# Patient Record
Sex: Female | Born: 1985 | Race: Black or African American | Hispanic: No | Marital: Single | Smoking: Never smoker
Health system: Southern US, Community
[De-identification: ages and names within clinical notes are randomized; demographics above are authoritative.]

## PROBLEM LIST (undated history)

## (undated) DIAGNOSIS — Z789 Other specified health status: Secondary | ICD-10-CM

## (undated) DIAGNOSIS — J189 Pneumonia, unspecified organism: Secondary | ICD-10-CM

---
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
## 2007-02-06 ENCOUNTER — Other Ambulatory Visit: Admission: RE | Admit: 2007-02-06 | Discharge: 2007-02-06 | Payer: Self-pay | Admitting: Obstetrics and Gynecology

## 2007-03-15 ENCOUNTER — Emergency Department (HOSPITAL_COMMUNITY): Admission: EM | Admit: 2007-03-15 | Discharge: 2007-03-15 | Payer: Self-pay | Admitting: Emergency Medicine

## 2008-06-01 ENCOUNTER — Emergency Department (HOSPITAL_COMMUNITY): Admission: EM | Admit: 2008-06-01 | Discharge: 2008-06-01 | Payer: Self-pay | Admitting: Emergency Medicine

## 2008-06-28 ENCOUNTER — Emergency Department (HOSPITAL_COMMUNITY): Admission: EM | Admit: 2008-06-28 | Discharge: 2008-06-29 | Payer: Self-pay | Admitting: Emergency Medicine

## 2009-01-14 ENCOUNTER — Emergency Department (HOSPITAL_COMMUNITY): Admission: EM | Admit: 2009-01-14 | Discharge: 2009-01-14 | Payer: Self-pay | Admitting: Emergency Medicine

## 2009-01-18 ENCOUNTER — Emergency Department (HOSPITAL_COMMUNITY): Admission: EM | Admit: 2009-01-18 | Discharge: 2009-01-18 | Payer: Self-pay | Admitting: Emergency Medicine

## 2009-04-23 ENCOUNTER — Emergency Department (HOSPITAL_COMMUNITY): Admission: EM | Admit: 2009-04-23 | Discharge: 2009-04-23 | Payer: Self-pay | Admitting: Family Medicine

## 2010-02-15 ENCOUNTER — Inpatient Hospital Stay (HOSPITAL_COMMUNITY)
Admission: AD | Admit: 2010-02-15 | Discharge: 2010-02-17 | Payer: Self-pay | Source: Home / Self Care | Attending: Obstetrics and Gynecology | Admitting: Obstetrics and Gynecology

## 2010-02-19 LAB — CBC
HCT: 36.7 % (ref 36.0–46.0)
HCT: 32.9 % — ABNORMAL LOW (ref 36.0–46.0)
Hemoglobin: 12.1 g/dL (ref 12.0–15.0)
Hemoglobin: 10.5 g/dL — ABNORMAL LOW (ref 12.0–15.0)
MCH: 28.7 pg (ref 26.0–34.0)
MCH: 28.1 pg (ref 26.0–34.0)
MCHC: 33 g/dL (ref 30.0–36.0)
MCHC: 31.9 g/dL (ref 30.0–36.0)
MCV: 87 fL (ref 78.0–100.0)
MCV: 88 fL (ref 78.0–100.0)
Platelets: 153 10*3/uL (ref 150–400)
Platelets: 148 10*3/uL — ABNORMAL LOW (ref 150–400)
RBC: 4.22 MIL/uL (ref 3.87–5.11)
RBC: 3.74 MIL/uL — ABNORMAL LOW (ref 3.87–5.11)
RDW: 15.4 % (ref 11.5–15.5)
RDW: 15.6 % — ABNORMAL HIGH (ref 11.5–15.5)
WBC: 11.4 10*3/uL — ABNORMAL HIGH (ref 4.0–10.5)
WBC: 12.4 10*3/uL — ABNORMAL HIGH (ref 4.0–10.5)

## 2010-02-19 LAB — RPR: RPR Ser Ql: NONREACTIVE

## 2010-02-25 ENCOUNTER — Encounter: Payer: Self-pay | Admitting: Obstetrics and Gynecology

## 2010-05-16 LAB — POCT URINALYSIS DIP (DEVICE)
Glucose, UA: NEGATIVE mg/dL
Ketones, ur: >=160 mg/dL — AB
Nitrite: NEGATIVE
Protein, ur: 30 mg/dL — AB
Specific Gravity, Urine: >=1.03 (ref 1.005–1.030)
Urobilinogen, UA: 0.2 mg/dL (ref 0.0–1.0)
pH: 5.5 (ref 5.0–8.0)

## 2010-05-16 LAB — WET PREP, GENITAL
Trich, Wet Prep: NONE SEEN
WBC, Wet Prep HPF POC: NONE SEEN
Yeast Wet Prep HPF POC: NONE SEEN

## 2010-05-16 LAB — POCT PREGNANCY, URINE: Preg Test, Ur: NEGATIVE

## 2010-05-16 LAB — GC/CHLAMYDIA PROBE AMP, GENITAL
Chlamydia, DNA Probe: NEGATIVE
GC Probe Amp, Genital: NEGATIVE

## 2010-06-19 NOTE — H&P (Signed)
Michele Weiss, SETHI NO.:  [id number]   MEDICAL RECORD NO.:  [id number]          PATIENT TYPE:  INP   LOCATION:                                FACILITY:  [facility]   PHYSICIAN:  Gerald Leitz, MD          DATE OF BIRTH:  [birth year]   DATE OF ADMISSION:  09/03/2006  DATE OF DISCHARGE:                              HISTORY & PHYSICAL   HISTORY OF PRESENT ILLNESS:  This is a 25 year old, G1, at 41 weeks and  3 days intrauterine pregnancy based on last menstrual period November 15, 2005, confirmed by ultrasound with estimated date of delivery August 24, 2006.  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  She was  treated for gonorrhea and chlamydia June 27 when she had 2 positive  tests.  Repeat chlamydia testing on July 22 was positive as well.  She  received another dose of azithromycin at that time.  She is being  induced secondary to post dates and is without complaint.   PAST MEDICAL HISTORY:  Negative.   PAST SURGICAL HISTORY:  Negative.   PAST OB HISTORY:  Current.   PAST GYN HISTORY:  Gonorrhea and chlamydia treated during this  pregnancy.  No history of abnormal Pap smears.   MEDICATIONS:  Vitamins.   ALLERGIES:  No known drug allergies.   SOCIAL HISTORY:  The patient is single.  She denies tobacco, alcohol  use.  History of marijuana use.  However, the patient says she did quit  while she is pregnant.   PHYSICAL EXAMINATION:  VITAL SIGNS:  Blood pressure 120/82. Weight 146  pounds.  CARDIOVASCULAR:  Regular rate and rhythm.  LUNGS:  Clear to auscultation bilaterally.  ABDOMEN:  Gravid, nontender.  EXTREMITIES:  Trace edema.  PELVIC:  Cervix 1 cm, 75% effaced, negative 1 station.   PRENATAL LABORATORY DATA:  Blood type A positive.  GBS negative.  HIV  nonreactive.  RPR nonreactive.  Rubella immune.  Hepatitis B negative.  Chlamydia positive on July 22, treated with  azithromycin.   IMPRESSION AND PLAN:  1. A 41-week 3-day intrauterine pregnancy.  2. Post dates for induction.  3. Cytotec per vagina.  4. Anticipate spontaneous vaginal delivery.      Gerald Leitz, MD  Electronically Signed     TC/MEDQ  D:  09/02/2006  T:  09/02/2006  Job:  [id number]

## 2010-11-19 LAB — COMPREHENSIVE METABOLIC PANEL
ALT: 16
AST: 20
Albumin: 2.3 — ABNORMAL LOW
Alkaline Phosphatase: 176 — ABNORMAL HIGH
BUN: 3 — ABNORMAL LOW
CO2: 21
Calcium: 8.7
Chloride: 109
Creatinine, Ser: 0.47
GFR calc Af Amer: >60
GFR calc non Af Amer: >60
Glucose, Bld: 89
Potassium: 3.5
Sodium: 136
Total Bilirubin: 0.5
Total Protein: 5.6 — ABNORMAL LOW

## 2010-11-19 LAB — URINALYSIS, ROUTINE W REFLEX MICROSCOPIC
Glucose, UA: NEGATIVE
Specific Gravity, Urine: 1.02

## 2010-11-19 LAB — URINE MICROSCOPIC-ADD ON

## 2010-11-19 LAB — CBC
HCT: 34.4 — ABNORMAL LOW
HCT: 34.9 — ABNORMAL LOW
Hemoglobin: 11.4 — ABNORMAL LOW
Hemoglobin: 11.4 — ABNORMAL LOW
MCHC: 32.6
MCHC: 33.1
MCV: 87.4
MCV: 87.5
Platelets: 208
Platelets: 224
RBC: 3.27 — ABNORMAL LOW
RBC: 3.93
RBC: 3.99
RDW: 15 — ABNORMAL HIGH
RDW: 15.3 — ABNORMAL HIGH
WBC: 10.7 — ABNORMAL HIGH
WBC: 10.8 — ABNORMAL HIGH
WBC: 12.2 — ABNORMAL HIGH

## 2010-11-19 LAB — RAPID URINE DRUG SCREEN, HOSP PERFORMED
Amphetamines: NOT DETECTED
Barbiturates: NOT DETECTED
Benzodiazepines: NOT DETECTED
Cocaine: NOT DETECTED
Opiates: NOT DETECTED
Tetrahydrocannabinol: NOT DETECTED

## 2010-11-19 LAB — LACTATE DEHYDROGENASE: LDH: 154

## 2010-11-19 LAB — GC/CHLAMYDIA PROBE AMP, GENITAL
Chlamydia, DNA Probe: POSITIVE — AB
GC Probe Amp, Genital: NEGATIVE

## 2010-11-19 LAB — RPR: RPR Ser Ql: NONREACTIVE

## 2011-02-07 ENCOUNTER — Emergency Department (HOSPITAL_COMMUNITY)
Admission: EM | Admit: 2011-02-07 | Discharge: 2011-02-07 | Disposition: A | Discharge status: Home / Self Care | Payer: Medicaid Other | Source: Home / Self Care | Attending: Family Medicine | Admitting: Family Medicine

## 2011-02-07 DIAGNOSIS — K120 Recurrent oral aphthae: Secondary | ICD-10-CM

## 2011-02-07 NOTE — ED Notes (Signed)
C/o "sore" inside of mouth.  States she gets these frequently.

## 2011-02-07 NOTE — ED Provider Notes (Signed)
History     CSN: [id number]  Arrival date & time 02/07/11  0806   First MD Initiated Contact with Patient 02/07/11 [phone]      Chief Complaint  Patient presents with  . Mouth Lesions    (Consider location/radiation/quality/duration/timing/severity/associated sxs/prior treatment) HPI Comments: Michele Weiss presents for evaluation of painful mouth lesions. She states that she gets these intermittently.   Patient is a 26 y.o. female presenting with mouth sores. The history is provided by the patient.  Mouth Lesions  The current episode started 2 days ago. The onset was sudden. The problem occurs occasionally. The problem has been unchanged. The problem is mild. The symptoms are relieved by nothing. The symptoms are aggravated by nothing. Associated symptoms include mouth sores. Pertinent negatives include no fever. She has been eating and drinking normally.    History reviewed. No pertinent past medical history.  History reviewed. No pertinent past surgical history.  No family history on file.  History  Substance Use Topics  . Smoking status: Never Smoker   . Smokeless tobacco: Not on file  . Alcohol Use: No    OB History    Grav Para Term Preterm Abortions TAB SAB Ect Mult Living                  Review of Systems  Constitutional: Negative.  Negative for fever.  HENT: Positive for mouth sores.   Eyes: Negative.   Respiratory: Negative.   Cardiovascular: Negative.   Gastrointestinal: Negative.   Genitourinary: Negative.   Musculoskeletal: Negative.   Skin: Negative.   Neurological: Negative.     Allergies  Review of patient's allergies indicates no known allergies.  Home Medications  No current outpatient prescriptions on file.  BP 106/74  Pulse 69  Temp(Src) 99 F (37.2 C) (Oral)  Resp 16  SpO2 99%  Physical Exam  Nursing note and vitals reviewed. Constitutional: She is oriented to person, place, and time. She appears well-developed and well-nourished.    HENT:  Head: Normocephalic and atraumatic.  Mouth/Throat: Uvula is midline, oropharynx is clear and moist and mucous membranes are normal. Oral lesions present.    Eyes: EOM are normal.  Neck: Normal range of motion.  Pulmonary/Chest: Effort normal.  Musculoskeletal: Normal range of motion.  Neurological: She is alert and oriented to person, place, and time.  Skin: Skin is warm and dry.  Psychiatric: Her behavior is normal.    ED Course  Procedures (including critical care time)  Labs Reviewed - No data to display No results found.   1. Aphthous ulcer       MDM          Richardo Priest, MD 02/07/11 [phone]

## 2011-08-07 ENCOUNTER — Encounter (HOSPITAL_COMMUNITY): Payer: Self-pay | Admitting: Emergency Medicine

## 2011-08-07 ENCOUNTER — Emergency Department (HOSPITAL_COMMUNITY)
Admission: EM | Admit: 2011-08-07 | Discharge: 2011-08-07 | Disposition: A | Discharge status: Home / Self Care | Payer: Medicaid Other | Source: Home / Self Care | Attending: Emergency Medicine | Admitting: Emergency Medicine

## 2011-08-07 DIAGNOSIS — K529 Noninfective gastroenteritis and colitis, unspecified: Secondary | ICD-10-CM

## 2011-08-07 DIAGNOSIS — K5289 Other specified noninfective gastroenteritis and colitis: Secondary | ICD-10-CM

## 2011-08-07 LAB — POCT URINALYSIS DIP (DEVICE)
Bilirubin Urine: NEGATIVE
Glucose, UA: NEGATIVE mg/dL
Ketones, ur: NEGATIVE mg/dL
Nitrite: NEGATIVE
Specific Gravity, Urine: 1.025 (ref 1.005–1.030)

## 2011-08-07 NOTE — ED Provider Notes (Signed)
History     CSN: [id number]  Arrival date & time 08/07/11  1437   None     Chief Complaint  Patient presents with  . Abdominal Pain    (Consider location/radiation/quality/duration/timing/severity/associated sxs/prior treatment) HPI Comments: Pt with 4 episodes watery/liquid diarrhea this morning.  Associated with abd pain pt describes at "a contraction".  Pt's pain mild/improved now.  Feels nauseated but no vomiting. Concerned may have a problem with her IUD.  Has had IUD for 1.5 years, has had bleeding with IUD since it was placed.  Denies any changes in her vaginal bleeding from usual.  Has not spoken with OB who placed IUD about bleeding.   Patient is a 26 y.o. female presenting with abdominal pain. The history is provided by the patient.  Abdominal Pain The primary symptoms of the illness include abdominal pain, nausea, diarrhea and vaginal bleeding. The primary symptoms of the illness do not include fever, vomiting, dysuria or vaginal discharge. The current episode started 6 to 12 hours ago. The onset of the illness was sudden. The problem has been gradually improving.  The abdominal pain is generalized. The abdominal pain does not radiate. The severity of the abdominal pain is 8/10. The abdominal pain is relieved by bowel movement.  The patient states that she believes she is currently not pregnant. The patient has had a change in bowel habit. Symptoms associated with the illness do not include chills, frequency or back pain.    History reviewed. No pertinent past medical history.  History reviewed. No pertinent past surgical history.  History reviewed. No pertinent family history.  History  Substance Use Topics  . Smoking status: Never Smoker   . Smokeless tobacco: Not on file  . Alcohol Use: Yes    OB History    Grav Para Term Preterm Abortions TAB SAB Ect Mult Living                  Review of Systems  Constitutional: Negative for fever and chills.    Gastrointestinal: Positive for nausea, abdominal pain and diarrhea. Negative for vomiting and blood in stool.  Genitourinary: Positive for vaginal bleeding. Negative for dysuria, frequency, flank pain, vaginal discharge and vaginal pain.  Musculoskeletal: Negative for back pain.    Allergies  Review of patient's allergies indicates no known allergies.  Home Medications  No current outpatient prescriptions on file.  BP 95/64  Pulse 74  Temp 98.7 F (37.1 C) (Oral)  Resp 14  SpO2 100%  Physical Exam  Constitutional: She appears well-developed and well-nourished. No distress.  Cardiovascular: Normal rate and regular rhythm.   Pulmonary/Chest: Effort normal and breath sounds normal.  Abdominal: Soft. Normal appearance. She exhibits no distension. Bowel sounds are increased. There is generalized tenderness. There is no rigidity, no rebound, no guarding and no CVA tenderness.  Genitourinary: Vagina normal. There is no rash, tenderness or lesion on the right labia. There is no rash, tenderness or lesion on the left labia. Uterus is tender. Cervix exhibits no motion tenderness and no friability. Right adnexum displays mass. No vaginal discharge found.       Bleeding from cervical os is old, dark blood, small amount.  IUD string intact.     ED Course  Procedures (including critical care time)  Labs Reviewed  POCT URINALYSIS DIP (DEVICE) - Abnormal; Notable for the following:    Hgb urine dipstick MODERATE (*)     All other components within normal limits  POCT PREGNANCY, URINE  No results found.   1. Enteritis       MDM  Reassured pt IUD string intact.  As pt's bleeding is not any different from when she had mirena placed 1.5 years ago, it unlikely to be cause of pt's abd pain today.  Review[facility] with [facility]; pt to f/u with OB for management of mirena or removal.          Cathlyn Parsons, NP 08/07/11 1728

## 2011-08-07 NOTE — ED Notes (Signed)
Abdominal pain onset this am.  Reports entire abdomen tightening like a "contraction"  Denies urinary symptoms.  Diarrhea all day today.  Has felt nauseated, but no vomiting .

## 2011-08-08 NOTE — ED Provider Notes (Signed)
Medical screening examination/treatment/procedure(s) were performed by non-physician practitioner and as supervising physician I was immediately available for consultation/collaboration.  Raynald Blend, MD 08/08/11 1453

## 2012-03-22 ENCOUNTER — Emergency Department (HOSPITAL_COMMUNITY)
Admission: EM | Admit: 2012-03-22 | Discharge: 2012-03-22 | Disposition: A | Discharge status: Home / Self Care | Payer: Medicaid Other | Source: Home / Self Care | Attending: Emergency Medicine | Admitting: Emergency Medicine

## 2012-03-22 ENCOUNTER — Other Ambulatory Visit (HOSPITAL_COMMUNITY)
Admission: RE | Admit: 2012-03-22 | Discharge: 2012-03-22 | Disposition: A | Discharge status: Home / Self Care | Payer: Medicaid Other | Source: Ambulatory Visit | Attending: Emergency Medicine | Admitting: Emergency Medicine

## 2012-03-22 ENCOUNTER — Encounter (HOSPITAL_COMMUNITY): Payer: Self-pay | Admitting: Emergency Medicine

## 2012-03-22 DIAGNOSIS — A6000 Herpesviral infection of urogenital system, unspecified: Secondary | ICD-10-CM

## 2012-03-22 DIAGNOSIS — A6009 Herpesviral infection of other urogenital tract: Secondary | ICD-10-CM

## 2012-03-22 DIAGNOSIS — N760 Acute vaginitis: Secondary | ICD-10-CM | POA: Insufficient documentation

## 2012-03-22 DIAGNOSIS — Z113 Encounter for screening for infections with a predominantly sexual mode of transmission: Secondary | ICD-10-CM | POA: Insufficient documentation

## 2012-03-22 LAB — POCT URINALYSIS DIP (DEVICE)
Bilirubin Urine: NEGATIVE
Hgb urine dipstick: NEGATIVE
Nitrite: NEGATIVE
pH: 7.5 (ref 5.0–8.0)

## 2012-03-22 MED ORDER — ACYCLOVIR 400 MG PO TABS: 400.0000 mg | ORAL_TABLET | ORAL | Status: DC

## 2012-03-22 MED ORDER — ACYCLOVIR 400 MG PO TABS: 400.0000 mg | ORAL_TABLET | Freq: Two times a day (BID) | ORAL | Status: DC

## 2012-03-22 NOTE — ED Provider Notes (Signed)
Chief Complaint  Patient presents with  . Vaginal Pain    vaginal pain and sores x 1 wk.     History of Present Illness:   Michele Weiss is a 27 year old female who has had a six-day history of vulvar pain. She's had a slight white discharge as well. She notes some odor. The patient had a Mirena inserted 05/04/2010. Ever since then she's had light bleeding at the beginning of every month. She had had pain up until this past week. She denies any fever, chills, nausea, vomiting, pelvic pain, lower back pain, and has had slight dysuria but no frequency or urgency. She denies any history of STDs or herpes. She has no medication allergies, takes no medications and has no other medical illnesses.  Review of Systems:  Other than noted above, the patient denies any of the following symptoms: Systemic:  No fever, chills, sweats, fatigue, or weight loss. GI:  No abdominal pain, nausea, anorexia, vomiting, diarrhea, constipation, melena or hematochezia. GU:  No dysuria, frequency, urgency, hematuria, vaginal discharge, itching, or abnormal vaginal bleeding. Skin:  No rash or itching.   PMFSH:  Past medical history, family history, social history, meds, and allergies were reviewed.  Physical Exam:   Vital signs:  BP 126/88  Pulse 87  Temp(Src) 98.9 F (37.2 C) (Oral)  Resp 18  SpO2 100%  LMP 03/17/2012 General:  Alert, oriented and in no distress. Lungs:  Breath sounds clear and equal bilaterally.  No wheezes, rales or rhonchi. Heart:  Regular rhythm.  No gallops or murmers. Abdomen:  Soft, flat and non-distended.  No organomegaly or mass.  No tenderness, guarding or rebound.  Bowel sounds normally active. Pelvic exam:  External genitalia reveals multiple small, shallow, painful ulcerations on the vulva consistent with herpes. Insertion of speculum was very painful for. She had a large amount of watery discharge which was brownish red in color. Her IUD strings are in place. Cervix otherwise  appeared normal. She had no cervical motion pain, uterus was normal in size and shape and nontender. No adnexal tenderness or mass. Skin:  Clear, warm and dry.  Labs:   Results for orders placed during the hospital encounter of 03/22/12  POCT URINALYSIS DIP (DEVICE)      Result Value Range   Glucose, UA NEGATIVE  NEGATIVE mg/dL   Bilirubin Urine NEGATIVE  NEGATIVE   Ketones, ur NEGATIVE  NEGATIVE mg/dL   Specific Gravity, Urine 1.020  1.005 - 1.030   Hgb urine dipstick NEGATIVE  NEGATIVE   pH 7.5  5.0 - 8.0   Protein, ur NEGATIVE  NEGATIVE mg/dL   Urobilinogen, UA 1.0  0.0 - 1.0 mg/dL   Nitrite NEGATIVE  NEGATIVE   Leukocytes, UA MODERATE (*) NEGATIVE  POCT PREGNANCY, URINE      Result Value Range   Preg Test, Ur NEGATIVE  NEGATIVE    Other Labs Obtained at Urgent Care Center:  DNA probes for gonorrhea, Chlamydia, Trichomonas, Gardnerella, and Candida were obtained as well as serologies for HIV, syphilis, herpes simplex type II, and a viral culture for herpes simplex type II.  Results are pending at this time and we will call about any positive results.  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  Plan:   1.  The following meds were prescribed:   New Prescriptions   ACYCLOVIR (ZOVIRAX) 400 MG TABLET    Take 1 tablet (400 mg total) by mouth every 4 (four) hours while awake.  ACYCLOVIR (ZOVIRAX) 400 MG TABLET    Take 1 tablet (400 mg total) by mouth 2 (two) times daily.   2.  The patient was instructed in symptomatic care and handouts were given. 3.  The patient was told to return if becoming worse in any way, if no better in 3 or 4 days, and given some red flag symptoms that would indicate earlier return.    Reuben Likes, MD 03/22/12 1556

## 2012-03-22 NOTE — ED Notes (Signed)
Please call pt on cell for lab issues 

## 2012-03-22 NOTE — ED Notes (Signed)
Pt c/o vaginal pain/tenderness and sores. Discharge with odor. Having pain with intercourse and with urinating. Symptoms present x 1 wk.  Pt has not tried any otc meds for treatment. Pt is currently on cycle.

## 2012-03-23 ENCOUNTER — Telehealth (HOSPITAL_COMMUNITY): Payer: Self-pay | Admitting: Emergency Medicine

## 2012-03-23 ENCOUNTER — Telehealth (HOSPITAL_COMMUNITY): Payer: Self-pay | Admitting: *Deleted

## 2012-03-23 LAB — HIV ANTIBODY (ROUTINE TESTING W REFLEX): HIV: NONREACTIVE

## 2012-03-23 LAB — HSV 2 ANTIBODY, IGG: HSV 2 Glycoprotein G Ab, IgG: 6.13 IV — ABNORMAL HIGH

## 2012-03-23 MED ORDER — LIDOCAINE HCL 2 % EX GEL: CUTANEOUS | Status: DC | PRN

## 2012-03-23 MED ORDER — METRONIDAZOLE 500 MG PO TABS: 500.0000 mg | ORAL_TABLET | Freq: Two times a day (BID) | ORAL | Status: DC

## 2012-03-23 NOTE — ED Notes (Signed)
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 Pt. adequately treated with Zovirax.  Order obtained from Dr. Lorenz Coaster for Flagyl.  I called pt.  Pt. verified x 2 and given results except Herpes culture. I told her I would only call her back if it was positive. Pt.'s questions answered.   Pt. told she needs Flagyl and it was at her preferred pharmacy.  Pt. instructed to no alcohol while taking this medication.  Pt. asked for medication for the pain. I told her I would ask the doctor to send something to her pharmacy for this.  Discussed with Dr. Lorenz Coaster and he said he would e- prescribe Lidocaine gel. Michele Weiss 03/23/2012

## 2012-03-23 NOTE — ED Notes (Signed)
Her DNA probe came back positive for Gardnerella as well. She will need Flagyl 500 mg twice a day for a week.  Reuben Likes, MD 03/23/12 [phone]

## 2012-03-23 NOTE — Telephone Encounter (Signed)
Message copied by Reuben Likes on Mon Mar 23, 2012  7:28 PM ------      Message from: Vassie Moselle      Created: Mon Mar 23, 2012  6:44 PM      Regarding: labs       Gardnerella pos. Herpes culture pending. 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  Rest of labs neg.  Treated with Zovirax only.      Vassie Moselle      03/23/2012            ----- Message -----         From: [facility]         Sent: 03/23/2012   1:52 AM           To: [facility] Follow Up                   ------

## 2012-03-25 LAB — HERPES SIMPLEX VIRUS CULTURE

## 2012-03-27 ENCOUNTER — Telehealth (HOSPITAL_COMMUNITY): Payer: Self-pay | Admitting: *Deleted

## 2012-03-27 NOTE — ED Notes (Signed)
Pt. called back.  Pt. verified x 2 and given Herpes culture result.  Pt. told she was adequately treated.  Pt. instructed to notify her partner. You can pass the virus even when you don't have an outbreak, so always practice safe sex. Get treated for each outbreak with Acyclovir or Valtrex. You may want to get an GYN or PCP  who can call in a prescription for you when you have an outbreak, or give you a Rx. for prophylactic treatment to prevent frequent outbreaks.  She said Dr. Lorenz Coaster gave her another Rx. for that after she finishes the current Rx. she is taking. I explained that we would not be able to refill it, so she needs a doctor.  Pt. voiced understanding. Vassie Moselle 03/27/2012

## 2012-03-27 NOTE — ED Notes (Signed)
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  Pt. adequately treated with Zovirax. I called and left a message to call. Vassie Moselle 03/27/2012

## 2012-07-26 ENCOUNTER — Emergency Department (HOSPITAL_COMMUNITY)
Admission: EM | Admit: 2012-07-26 | Discharge: 2012-07-26 | Disposition: A | Discharge status: Home / Self Care | Payer: Self-pay | Source: Home / Self Care

## 2013-12-09 ENCOUNTER — Emergency Department (HOSPITAL_COMMUNITY): Payer: Medicaid Other

## 2013-12-09 ENCOUNTER — Emergency Department (HOSPITAL_COMMUNITY)
Admission: EM | Admit: 2013-12-09 | Discharge: 2013-12-09 | Disposition: A | Discharge status: Home / Self Care | Payer: Medicaid Other | Attending: Emergency Medicine | Admitting: Emergency Medicine

## 2013-12-09 ENCOUNTER — Encounter (HOSPITAL_COMMUNITY): Payer: Self-pay | Admitting: *Deleted

## 2013-12-09 DIAGNOSIS — Z792 Long term (current) use of antibiotics: Secondary | ICD-10-CM | POA: Insufficient documentation

## 2013-12-09 DIAGNOSIS — R209 Unspecified disturbances of skin sensation: Secondary | ICD-10-CM

## 2013-12-09 DIAGNOSIS — H5789 Other specified disorders of eye and adnexa: Secondary | ICD-10-CM

## 2013-12-09 DIAGNOSIS — R202 Paresthesia of skin: Secondary | ICD-10-CM | POA: Insufficient documentation

## 2013-12-09 DIAGNOSIS — Z79899 Other long term (current) drug therapy: Secondary | ICD-10-CM | POA: Insufficient documentation

## 2013-12-09 DIAGNOSIS — Z8619 Personal history of other infectious and parasitic diseases: Secondary | ICD-10-CM | POA: Insufficient documentation

## 2013-12-09 DIAGNOSIS — R930 Abnormal findings on diagnostic imaging of skull and head, not elsewhere classified: Secondary | ICD-10-CM | POA: Insufficient documentation

## 2013-12-09 DIAGNOSIS — H578 Other specified disorders of eye and adnexa: Secondary | ICD-10-CM | POA: Insufficient documentation

## 2013-12-09 NOTE — ED Provider Notes (Signed)
CSN: [id number]     Arrival date & time 12/09/13  1413 History   First MD Initiated Contact with Patient 12/09/13 1822     Chief Complaint  Patient presents with  . Facial numbness      (Consider location/radiation/quality/duration/timing/severity/associated sxs/prior Treatment) HPI Comments: Michele Weiss is a 28 y.o. 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 She reports that it began around her lips on the L side, and has spread up towards her eye, and feels like it's going "behind the eye" on her L side. States the sensation is diminished, but that she can still feel herself touching the skin. Compares it to the sensation when the novocaine is wearing off at the dentists. The sensation is also present on the L side of her tongue. She denies any known triggers or aggravating/alleviating factors. Denies pain in the affected areas of her face. She has not tried anything for her symptoms. She also states this morning she awoke with mild eye redness which she attributed to allergies. Denies fevers, chills, facial tingling, rashes, itching, ocular discharge or itching, tearing, eye pain, vision changes, rhinorrhea, sinus congestion, sore throat, drooling, ear pain/discharge, tinnitus, hearing loss, vertigo, dizziness, lightheadedness, syncope, CP, SOB, abd pain, n/v/d/c, melena, hematochezia, myalgias, arthralgias, paresthesias/tingling/weakness in any other areas of her body, facial asymmetry or weakness, or focal neuro deficits. Does not take anything for her HSV1, which she states was diagnosed in childhood after getting a cold sore.  Patient is a 28 y.o. female presenting with general illness. The history is provided by the patient. No language interpreter was used.  Illness Location:  L sided face Quality:  "numbness"/decreased sensation Severity:  Mild Onset quality:  Gradual Duration:  2  weeks Timing:  Constant Progression:  Unchanged Chronicity:  New Context:  No injury, no known trigger Relieved by:  None tried Worsened by:  None tried Ineffective treatments:  None tried Associated symptoms: no abdominal pain, no chest pain, no congestion, no cough, no diarrhea, no ear pain, no fever, no headaches, no loss of consciousness, no myalgias, no nausea, no rash, no rhinorrhea, no shortness of breath, no sore throat and no vomiting   Risk factors:  +HSV1 in childhood   History reviewed. No pertinent past medical history. History reviewed. No pertinent past surgical history. History reviewed. No pertinent family history. History  Substance Use Topics  . Smoking status: Never Smoker   . Smokeless tobacco: Not on file  . Alcohol Use: Yes   OB History    No data available     Review of Systems  Constitutional: Negative for fever and chills.  HENT: Negative for congestion, drooling, ear discharge, ear pain, facial swelling, hearing loss, rhinorrhea, sinus pressure, sore throat, tinnitus and trouble swallowing.   Eyes: Positive for redness. Negative for photophobia, pain, discharge, itching and visual disturbance.  Respiratory: Negative for cough and shortness of breath.   Cardiovascular: Negative for chest pain.  Gastrointestinal: Negative for nausea, vomiting, abdominal pain, diarrhea, constipation and blood in stool.  Musculoskeletal: Negative for myalgias, back pain, arthralgias, gait problem, neck pain and neck stiffness.  Skin: Negative for color change and rash.  Allergic/Immunologic: Positive for environmental allergies.  Neurological: Positive for numbness (L sided facial paresthesias). Negative for dizziness, tremors, loss of consciousness, syncope, facial asymmetry, speech difficulty, weakness, light-headedness and headaches.  Psychiatric/Behavioral: Negative for confusion.   10 Systems reviewed and are  negative for acute change except as noted in the HPI.     Allergies  Review of patient's allergies indicates no known allergies.  Home Medications   Prior to Admission medications   Medication Sig Start Date End Date Taking? Authorizing Provider  ibuprofen (ADVIL,MOTRIN) 200 MG tablet Take 800 mg by mouth every 6 (six) hours as needed for headache.   Yes Historical Provider, MD  levonorgestrel (MIRENA) 20 MCG/24HR IUD 1 each by Intrauterine route once.   Yes Historical Provider, MD  acyclovir (ZOVIRAX) 400 MG tablet Take 1 tablet (400 mg total) by mouth every 4 (four) hours while awake. 03/22/12   Reuben Likesavid C Keller, MD  acyclovir (ZOVIRAX) 400 MG tablet Take 1 tablet (400 mg total) by mouth 2 (two) times daily. 03/22/12   Reuben Likesavid C Keller, MD  lidocaine (XYLOCAINE JELLY) 2 % jelly Apply topically as needed. 03/23/12   Reuben Likesavid C Keller, MD  metroNIDAZOLE (FLAGYL) 500 MG tablet Take 1 tablet (500 mg total) by mouth 2 (two) times daily. 03/23/12   Reuben Likesavid C Keller, MD   BP 110/78 mmHg  Pulse 87  Temp(Src) 98.3 F (36.8 C) (Oral)  Resp 20  Ht 5\' 2"  (1.575 m)  Wt 130 lb (58.968 kg)  BMI 23.77 kg/m2  SpO2 99%  LMP 11/21/2013 Physical Exam  Constitutional: She is oriented to person, place, and time. Vital signs are normal. She appears well-developed and well-nourished.  Non-toxic appearance. No distress.  Afebrile, nontoxic, NAD  HENT:  Head: Normocephalic and atraumatic.  Right Ear: Hearing, tympanic membrane, external ear and ear canal normal.  Left Ear: Hearing, tympanic membrane, external ear and ear canal normal.  Nose: Mucosal edema present. No rhinorrhea. Right sinus exhibits no maxillary sinus tenderness and no frontal sinus tenderness. Left sinus exhibits no maxillary sinus tenderness and no frontal sinus tenderness.  Mouth/Throat: Uvula is midline, oropharynx is clear and moist and mucous membranes are normal. No trismus in the jaw. No uvula swelling.  [place]/AT Ears clear bilaterally B/l nasal turbinates mildly edematous and erythematous with  cobblestoning, no rhinorrhea Oropharynx clear and moist  Eyes: EOM are normal. Pupils are equal, round, and reactive to light. Right eye exhibits no discharge. Left eye exhibits no discharge. Right conjunctiva is injected. Left conjunctiva is injected.  B/l global injection of conjunctiva, no drainage. EOMI, PERRL  Neck: Normal range of motion. Neck supple. No JVD present. Carotid bruit is not present.  Cardiovascular: Normal rate, regular rhythm, normal heart sounds and intact distal pulses.  Exam reveals no gallop and no friction rub.   No murmur heard. Pulmonary/Chest: Effort normal and breath sounds normal. No respiratory distress. She has no decreased breath sounds. She has no wheezes. She has no rhonchi. She has no rales.  Abdominal: Soft. Normal appearance and bowel sounds are normal. She exhibits no distension. There is no tenderness. There is no rigidity, no rebound, no guarding, no tenderness at McBurney's point and negative Murphy's sign.  Musculoskeletal: Normal range of motion.  MAE x4 Strength 5/5 in all extremities Sensation grossly intact Gait nonataxic  Lymphadenopathy:    She has no cervical adenopathy.  Neurological: She is alert and oriented to person, place, and time. She has normal strength. No cranial nerve deficit or sensory deficit. She displays a negative Romberg sign. Coordination and gait normal. GCS eye subscore is 4. GCS verbal subscore is 5. GCS motor subscore is 6.  CN 2-12 grossly intact Sensation over CN5 distribution of intact to sharp vs light touch, but pt  endorses a "different" sensation on the L side Sensation and strength equal and intact in all extremities Neg romberg, neg pronator drift, coordination with finger-to-nose WNL, gait WNL    Skin: Skin is warm, dry and intact. No rash noted.  No rashes  Psychiatric: She has a normal mood and affect.  Nursing note and vitals reviewed.   ED Course  Procedures (including critical care time) Labs  Review Labs Reviewed - No data to display  Imaging Review Ct Head Wo Contrast  12/09/2013   CLINICAL DATA:  Left facial numbness and bilateral temporal headache for 2 weeks.  EXAM: CT HEAD WITHOUT CONTRAST  TECHNIQUE: Contiguous axial images were obtained from the base of the skull through the vertex without intravenous contrast.  COMPARISON:  01/14/2009  FINDINGS: Skull and Sinuses:No acute fracture or destructive process.  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 This has developed since 2010, as permitted by slice thickness and slice selection. This area is low-density on brain windows and likely contain CSF. These changes are discrete from the petrous apex which is bilaterally aerated. There is a questionable change in the density of the sella, possibly now with an empty appearance.  Orbits: No new thickening of the optic sheaths.  Brain: No evidence of acute abnormality, such as acute infarction, hemorrhage, hydrocephalus, or mass lesion/mass effect.  IMPRESSION: 1. No acute intracranial findings. 2. 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 This can be related to alteration in CSF pressure; if there are chronic headaches, consider workup for pseudotumor cerebri.   Electronically Signed   By: Tiburcio PeaJonathan  Watts M.D.   On: 12/09/2013 20:25     EKG Interpretation None      MDM   Final diagnoses:  Facial paresthesia  Abnormal CT scan of head  Redness of eye, left    28y/o female with L sided facial paresthesias, hx of HSV1 in childhood. DDx wide, but could include trigeminal neuralgia vs HSV of trigeminal nerve vs tumor vs other intracranial pathology. Will obtain CT head to eval for tumor or other changes. Plan to refer to opthalmology for eye redness that began today, given her hx of HSV1, but doubt any concerning etiology or need for emergent work up given that she has no ocular pain or discharge or vision changes today.  Doubt glaucoma or corneal ulcers. This redness could still be allergic in nature. Could consider starting on valtrex for HSV but will hold off until CT results back. Plan to likely refer to neurology for ongoing management of facial paresthesia, and any other findings from CT.  9:05 PM CT showing no acute changes, does show meckel's cave remodeling possibly related to alteration in CSF pressure. Pt with chronic headaches, and per radiologist, she would warrant w/up for idiopathic intracranial hypertension (pseudotumor cerebri). Given no concerning neuro deficits at this time and not complaining of HA at this time, doubt need for emergent LP, will defer to neurology for management and work up. 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 Will still proceed with neurology referral, and discussed referral to ophthalmology if ocular redness does not diminish by tomorrow after using home allergy meds and OTC visine. I explained the diagnosis and have given explicit precautions to return to the ER including for any other new  or worsening symptoms. The patient understands and accepts the medical plan as it's been dictated and I have answered their questions. Discharge instructions concerning home care and prescriptions have been given. The patient is STABLE and is discharged to home in good condition.  BP 110/78 mmHg  Pulse 87  Temp(Src) 98.3 F (36.8 C) (Oral)  Resp 20  Ht 5\' 2"  (1.575 m)  Wt 130 lb (58.968 kg)  BMI 23.77 kg/m2  SpO2 99%  LMP 11/21/2013   [place]ita [place] Camprubi-Soms, PA-C 12/09/13 2115  Vanetta Mulders, MD 12/13/13 1811

## 2013-12-09 NOTE — ED Notes (Addendum)
Pt reports left side face numbness x2 weeks.  Pt reports it started with half of her mouth and nostril and cheek on the right side. Pt reports when she woke up today she felt the numbness enter her eye and under chin on the left side.  Pt denies any pain, or any loss of function in her face.  Pt denies any weakness in any extremities. Pt reports h/a since early today on left lateral temple.

## 2013-12-09 NOTE — Discharge Instructions (Signed)
Your symptoms will need further work up by a neurologist. Call the neurologist listed above for ongoing management and treatment of your symptoms. Try taking your home allergy medications, or over-the-counter visine for your eye redness, but if this doesn't resolve then see the ophthalmologist listed above for further evaluation of this symptom. Return to the ER for changes or worsening in your symptoms.   Paresthesia Paresthesia is a burning or prickling feeling. This feeling can happen in any part of the body. It often happens in the hands, arms, legs, or feet. HOME CARE  Avoid drinking alcohol.  Try massage or needle therapy (acupuncture) to help with your problems.  Keep all doctor visits as told. GET HELP RIGHT AWAY IF:   You feel weak.  You have trouble walking or moving.  You have problems speaking or seeing.  You feel confused.  You cannot control when you poop (bowel movement) or pee (urinate).  You lose feeling (numbness) after an injury.  You pass out (faint).  Your burning or prickling feeling gets worse when you walk.  You have pain, cramps, or feel dizzy.  You have a rash. MAKE SURE YOU:   Understand these instructions.  Will watch your condition.  Will get help right away if you are not doing well or get worse. Document Released: 01/04/2008 Document Revised: 04/15/2011 Document Reviewed: 10/12/2010 [facility] Patient Information 2015 [place], [place]. This information is not intended to replace advice given to you by your health care provider. Make sure you discuss any questions you have with your health care provider.

## 2013-12-09 NOTE — ED Notes (Signed)
Pt in c/o left facial numbness that starts around her mouth to her jaw line, up to eye brow area, states symptoms started two weeks ago and have been getting progressively worse, no other neuro deficits noted

## 2014-01-24 ENCOUNTER — Telehealth: Payer: Self-pay | Admitting: Neurology

## 2014-01-24 ENCOUNTER — Ambulatory Visit: Payer: Medicaid Other | Admitting: Neurology

## 2014-01-24 NOTE — Telephone Encounter (Signed)
Pt decided to not be seen as  NP on 01/24/14 w/ Dr. Everlena CooperJaffe. Pt did not have her insurance card and due to her  Insurance being medicaid/famioly planning, her insurance was not guaranteed to pay for her visit unless it was related to her pregnancy.  Pt at first wanted to be seen but then changed her mind and left the office.  She was a ER referral.

## 2014-01-25 ENCOUNTER — Telehealth: Payer: Self-pay | Admitting: Neurology

## 2014-01-25 NOTE — Telephone Encounter (Signed)
Pt no showed 01/24/14 appt with Dr. Allena KatzPatel / ER referral.  Michele DroughtErica - please send no show letter to patient / Sherri S.

## 2014-01-26 ENCOUNTER — Encounter: Payer: Self-pay | Admitting: *Deleted

## 2014-01-26 NOTE — Progress Notes (Signed)
No show letter sent for 01/24/2014 

## 2015-02-05 HISTORY — PX: OOPHORECTOMY: SHX86

## 2016-02-05 NOTE — L&D Delivery Note (Signed)
Delivery Note At 4:27 PM a viable female was delivered via Vaginal, Spontaneous Delivery (Presentation: LOA;  ).  APGAR: 9, 9; weight  .   Placenta status: , .  Cord:  with the following complications: .  Cord pH: NA  Anesthesia:  None  Episiotomy: None Lacerations:  Small hemostatic first degree Suture Repair: NA Est. Blood Loss (mL):  200 cc  Mom to postpartum.  Baby to Couplet care / Skin to Skin.  Michele Weiss, Michele Weiss 04/02/2016, 5:00 PM

## 2016-03-26 ENCOUNTER — Encounter (HOSPITAL_COMMUNITY): Payer: Self-pay

## 2016-03-26 ENCOUNTER — Inpatient Hospital Stay (HOSPITAL_COMMUNITY)
Admission: AD | Admit: 2016-03-26 | Discharge: 2016-03-26 | Disposition: A | Discharge status: Home / Self Care | Payer: Medicaid Other | Source: Ambulatory Visit | Attending: Family Medicine | Admitting: Family Medicine

## 2016-03-26 DIAGNOSIS — Z3A40 40 weeks gestation of pregnancy: Secondary | ICD-10-CM | POA: Diagnosis not present

## 2016-03-26 DIAGNOSIS — O0933 Supervision of pregnancy with insufficient antenatal care, third trimester: Secondary | ICD-10-CM | POA: Diagnosis not present

## 2016-03-26 DIAGNOSIS — Z3493 Encounter for supervision of normal pregnancy, unspecified, third trimester: Secondary | ICD-10-CM | POA: Diagnosis not present

## 2016-03-26 HISTORY — DX: Other specified health status: Z78.9

## 2016-03-26 LAB — DIFFERENTIAL
Basophils Absolute: 0 10*3/uL (ref 0.0–0.1)
Basophils Relative: 0 %
EOS PCT: 1 %
Eosinophils Absolute: 0.1 10*3/uL (ref 0.0–0.7)
LYMPHS ABS: 1.9 10*3/uL (ref 0.7–4.0)
LYMPHS PCT: 22 %
Monocytes Absolute: 0.4 10*3/uL (ref 0.1–1.0)
Monocytes Relative: 5 %
NEUTROS ABS: 6.3 10*3/uL (ref 1.7–7.7)
NEUTROS PCT: 72 %

## 2016-03-26 LAB — RAPID HIV SCREEN (HIV 1/2 AB+AG)
HIV 1/2 ANTIBODIES: NONREACTIVE
HIV-1 P24 ANTIGEN - HIV24: NONREACTIVE

## 2016-03-26 LAB — TYPE AND SCREEN
ABO/RH(D): A POS
ANTIBODY SCREEN: NEGATIVE

## 2016-03-26 LAB — URINALYSIS, ROUTINE W REFLEX MICROSCOPIC
Bilirubin Urine: NEGATIVE
GLUCOSE, UA: 50 mg/dL — AB
Hgb urine dipstick: NEGATIVE
Ketones, ur: NEGATIVE mg/dL
LEUKOCYTES UA: NEGATIVE
NITRITE: NEGATIVE
PH: 6 (ref 5.0–8.0)
PROTEIN: NEGATIVE mg/dL
Specific Gravity, Urine: 1.015 (ref 1.005–1.030)

## 2016-03-26 LAB — OB RESULTS CONSOLE GBS: STREP GROUP B AG: NEGATIVE

## 2016-03-26 LAB — CBC
HCT: 34 % — ABNORMAL LOW (ref 36.0–46.0)
HEMOGLOBIN: 11.5 g/dL — AB (ref 12.0–15.0)
MCH: 29.6 pg (ref 26.0–34.0)
MCHC: 33.8 g/dL (ref 30.0–36.0)
MCV: 87.4 fL (ref 78.0–100.0)
PLATELETS: 150 10*3/uL (ref 150–400)
RBC: 3.89 MIL/uL (ref 3.87–5.11)
RDW: 14.1 % (ref 11.5–15.5)
WBC: 8.7 10*3/uL (ref 4.0–10.5)

## 2016-03-26 LAB — HEPATITIS B SURFACE ANTIGEN: Hepatitis B Surface Ag: NEGATIVE

## 2016-03-26 NOTE — Discharge Instructions (Signed)
Introduction Patient Name: ________________________________________________ Patient Due Date: ____________________ What is a fetal movement count? A fetal movement count is the number of times that you feel your baby move during a certain amount of time. This may also be called a fetal kick count. A fetal movement count is recommended for every pregnant woman. You may be asked to start counting fetal movements as early as week 28 of your pregnancy. Pay attention to when your baby is most active. You may notice your baby's sleep and wake cycles. You may also notice things that make your baby move more. You should do a fetal movement count:  When your baby is normally most active.  At the same time each day. A good time to count movements is while you are resting, after having something to eat and drink. How do I count fetal movements? 1. Find a quiet, comfortable area. Sit, or lie down on your side. 2. Write down the date, the start time and stop time, and the number of movements that you felt between those two times. Take this information with you to your health care visits. 3. For 2 hours, count kicks, flutters, swishes, rolls, and jabs. You should feel at least 10 movements during 2 hours. 4. You may stop counting after you have felt 10 movements. 5. If you do not feel 10 movements in 2 hours, have something to eat and drink. Then, keep resting and counting for 1 hour. If you feel at least 4 movements during that hour, you may stop counting. Contact a health care provider if:  You feel fewer than 4 movements in 2 hours.  Your baby is not moving like he or she usually does. Date: ____________ Start time: ____________ Stop time: ____________ Movements: ____________ Date: ____________ Start time: ____________ Stop time: ____________ Movements: ____________ Date: ____________ Start time: ____________ Stop time: ____________ Movements: ____________ Date: ____________ Start time: ____________  Stop time: ____________ Movements: ____________ Date: ____________ Start time: ____________ Stop time: ____________ Movements: ____________ Date: ____________ Start time: ____________ Stop time: ____________ Movements: ____________ Date: ____________ Start time: ____________ Stop time: ____________ Movements: ____________ Date: ____________ Start time: ____________ Stop time: ____________ Movements: ____________ Date: ____________ Start time: ____________ Stop time: ____________ Movements: ____________ This information is not intended to replace advice given to you by your health care provider. Make sure you discuss any questions you have with your health care provider. Document Released: 02/20/2006 Document Revised: 09/20/2015 Document Reviewed: 03/02/2015 Elsevier Interactive Patient Education  2017 [employer] Braxton Hicks Contractions Contractions of the uterus can occur throughout pregnancy. Contractions are not always a sign that you are in labor.  WHAT ARE BRAXTON HICKS CONTRACTIONS?  Contractions that occur before labor are called Braxton Hicks contractions, or false labor. Toward the end of pregnancy (32-34 weeks), these contractions can develop more often and may become more forceful. This is not true labor because these contractions do not result in opening (dilatation) and thinning of the cervix. They are sometimes difficult to tell apart from true labor because these contractions can be forceful and people have different pain tolerances. You should not feel embarrassed if you go to the hospital with false labor. Sometimes, the only way to tell if you are in true labor is for your health care provider to look for changes in the cervix. If there are no prenatal problems or other health problems associated with the pregnancy, it is completely safe to be sent home with false labor and await the onset of true labor.   HOW CAN YOU TELL THE DIFFERENCE BETWEEN TRUE AND FALSE LABOR? False Labor     The contractions of false labor are usually shorter and not as hard as those of true labor.   The contractions are usually irregular.   The contractions are often felt in the front of the lower abdomen and in the groin.   The contractions may go away when you walk around or change positions while lying down.   The contractions get weaker and are shorter lasting as time goes on.   The contractions do not usually become progressively stronger, regular, and closer together as with true labor.  True Labor   Contractions in true labor last 30-70 seconds, become very regular, usually become more intense, and increase in frequency.   The contractions do not go away with walking.   The discomfort is usually felt in the top of the uterus and spreads to the lower abdomen and low back.   True labor can be determined by your health care provider with an exam. This will show that the cervix is dilating and getting thinner.  WHAT TO REMEMBER  Keep up with your usual exercises and follow other instructions given by your health care provider.   Take medicines as directed by your health care provider.   Keep your regular prenatal appointments.   Eat and drink lightly if you think you are going into labor.   If Braxton Hicks contractions are making you uncomfortable:   Change your position from lying down or resting to walking, or from walking to resting.   Sit and rest in a tub of warm water.   Drink 2-3 glasses of water. Dehydration may cause these contractions.   Do slow and deep breathing several times an hour.  WHEN SHOULD I SEEK IMMEDIATE MEDICAL CARE? Seek immediate medical care if:  Your contractions become stronger, more regular, and closer together.   You have fluid leaking or gushing from your vagina.   You have a fever.   You pass blood-tinged mucus.   You have vaginal bleeding.   You have continuous abdominal pain.   You have low back pain  that you never had before.   You feel your baby's head pushing down and causing pelvic pressure.   Your baby is not moving as much as it used to.  This information is not intended to replace advice given to you by your health care provider. Make sure you discuss any questions you have with your health care provider. Document Released: 01/21/2005 Document Revised: 05/15/2015 Document Reviewed: 11/02/2012 Elsevier Interactive Patient Education  2017 [employer]  

## 2016-03-26 NOTE — MAU Provider Note (Signed)
History     CSN: [id number]  Arrival date and time: 03/26/16 [id number]   First Provider Initiated Contact with Patient 03/26/16 1948      Chief Complaint  Patient presents with  . check up   HPI [name] is a 31 y.o. [id number] at 6412w5d who presents to MAU today with complaint of need for a check up. The patient states that she has recently moved back to the area (end of November). She denies complications with the pregnancy. She denies vaginal bleeding, LOF or contractions today. She reports good fetal movement.   OB History    Gravida Para Term Preterm AB Living   4 2 1   1 2    SAB TAB Ectopic Multiple Live Births       1   2      Past Medical History:  Diagnosis Date  . Medical history non-contributory     Past Surgical History:  Procedure Laterality Date  . OOPHORECTOMY Right 2017    No family history on file.  Social History  Substance Use Topics  . Smoking status: Never Smoker  . Smokeless tobacco: Not on file  . Alcohol use Yes    Allergies: No Known Allergies  Prescriptions Prior to Admission  Medication Sig Dispense Refill Last Dose  . acyclovir (ZOVIRAX) 400 MG tablet Take 1 tablet (400 mg total) by mouth every 4 (four) hours while awake. (Patient not taking: Reported on 03/26/2016) 35 tablet 0 Completed Course at Unknown time  . acyclovir (ZOVIRAX) 400 MG tablet Take 1 tablet (400 mg total) by mouth 2 (two) times daily. (Patient not taking: Reported on 03/26/2016) 60 tablet 12 Completed Course at Unknown time  . lidocaine (XYLOCAINE JELLY) 2 % jelly Apply topically as needed. (Patient not taking: Reported on 03/26/2016) 30 mL 0 Completed Course at Unknown time  . metroNIDAZOLE (FLAGYL) 500 MG tablet Take 1 tablet (500 mg total) by mouth 2 (two) times daily. (Patient not taking: Reported on 03/26/2016) 14 tablet 0 Completed Course at Unknown time    Review of Systems  Constitutional: Negative for fever.  Gastrointestinal: Negative for abdominal pain.   Genitourinary: Negative for vaginal bleeding and vaginal discharge.   Physical Exam   Blood pressure 126/80, pulse 88, temperature 98.5 F (36.9 C), temperature source Oral, resp. rate 18, height 5\' 2"  (1.575 m), weight 155 lb (70.3 kg), SpO2 98 %.  Physical Exam  Nursing note and vitals reviewed. Constitutional: She is oriented to person, place, and time. She appears well-developed and well-nourished. No distress.  HENT:  Head: Normocephalic and atraumatic.  Cardiovascular: Normal rate.   Respiratory: Effort normal.  GI: Soft. She exhibits no distension. There is no tenderness.  Neurological: She is alert and oriented to person, place, and time.  Skin: Skin is warm and dry. No erythema.  Psychiatric: She has a normal mood and affect.    Results for orders placed or performed during the hospital encounter of 03/26/16 (from the past 24 hour(s))  Urinalysis, Routine w reflex microscopic     Status: Abnormal   Collection Time: 03/26/16  7:20 PM  Result Value Ref Range   Color, Urine YELLOW YELLOW   APPearance HAZY (A) CLEAR   Specific Gravity, Urine 1.015 1.005 - 1.030   pH 6.0 5.0 - 8.0   Glucose, UA 50 (A) NEGATIVE mg/dL   Hgb urine dipstick NEGATIVE NEGATIVE   Bilirubin Urine NEGATIVE NEGATIVE   Ketones, ur NEGATIVE NEGATIVE mg/dL   Protein, ur  NEGATIVE NEGATIVE mg/dL   Nitrite NEGATIVE NEGATIVE   Leukocytes, UA NEGATIVE NEGATIVE   Fetal Monitoring: Baseline: 140 bpm Variability: moderate Accelerations: 15 x 15 Decelerations: none Contractions: none   MAU Course  Procedures None   MDM UA today  RN attempted to request records from other OB office. They are not available at this time of night. LM to return call to MAU for fax information.  Discussed patient with Dr. Adrian Blackwater. Obtain prenatal panel, GC/Chlamydia and GBS today.  Assessment and Plan  A: SIUP at [redacted]w[redacted]d Lapse in prenatal care   P: Discharge home Labor precautions discussed Patient advised to  follow-up with [facility] tomorrow at 8:40 am to establish care Patient may return to MAU as needed or if her condition were to change or worsen   Marny Lowenstein, PA-C  03/26/2016, 8:14 PM

## 2016-03-26 NOTE — MAU Note (Signed)
Pt here for "check up;" moved here from [place] and hasn't had a doctor here since moving. Denies any problems with the pregnancy; hasn't been seen in the office since late November. Denies any bleeding or leaking of fluid; reports positive fetal movement. Has had 2 prior vaginal deliveries with no complications.

## 2016-03-26 NOTE — MAU Note (Signed)
Doctor office in [place] ([facility], [place]  called to try to obtain prenatal records; left message.

## 2016-03-27 ENCOUNTER — Encounter: Payer: Self-pay | Admitting: Advanced Practice Midwife

## 2016-03-27 ENCOUNTER — Ambulatory Visit (INDEPENDENT_AMBULATORY_CARE_PROVIDER_SITE_OTHER): Payer: Medicaid Other | Admitting: Advanced Practice Midwife

## 2016-03-27 DIAGNOSIS — Z3483 Encounter for supervision of other normal pregnancy, third trimester: Secondary | ICD-10-CM

## 2016-03-27 DIAGNOSIS — Z3493 Encounter for supervision of normal pregnancy, unspecified, third trimester: Secondary | ICD-10-CM | POA: Insufficient documentation

## 2016-03-27 LAB — RPR: RPR: NONREACTIVE

## 2016-03-27 LAB — RUBELLA SCREEN

## 2016-03-27 LAB — ABO/RH: ABO/RH(D): A POS

## 2016-03-27 NOTE — Progress Notes (Signed)
   PRENATAL VISIT NOTE  Subjective:  Michele Weiss is a 31 y.o. [phone] at 1843w1d being seen today for her initial OB visit.  She is currently monitored for the following issues for this low-risk pregnancy and has FIBROADENOSIS, BREAST and Supervision of normal pregnancy in third trimester on her problem list.  Patient reports no complaints.  Contractions: Not present. Vag. Bleeding: None.  Movement: Present. Denies leaking of fluid.   The following portions of the patient's history were reviewed and updated as appropriate: allergies, current medications, past family history, past medical history, past social history, past surgical history and problem list. Problem list updated.  Objective:   Vitals:   03/27/16 0901  BP: 122/88  Pulse: 82  Weight: 169 lb 4.8 oz (76.8 kg)    Fetal Status: Fetal Heart Rate (bpm): 150 Fundal Height: 39 cm Movement: Present     General:  Alert, oriented and cooperative. Patient is in no acute distress.  Skin: Skin is warm and dry. No rash noted.   Cardiovascular: Normal heart rate noted  Respiratory: Normal respiratory effort, no problems with respiration noted  Abdomen: Soft, gravid, appropriate for gestational age. Pain/Pressure: Present     Pelvic:  Cervical exam deferred        Extremities: Normal range of motion.  Edema: None  Mental Status: Normal mood and affect. Normal behavior. Normal judgment and thought content.   Assessment and Plan:  Pregnancy: [id number] at 3643w1d  1. Encounter for supervision of other normal pregnancy in third trimester --Prenatal labs done yesterday in MAU, including GCC and GBS --Record requested from [place] --Pt prefers a natural labor, has a birth plan, prefers midwives.  Encouraged her to bring birth plan to next visit (NST this week for postdates).  Discussed delayed cord-clamping, placenta encapsulation, skin-to-skin time with newborn, saline lock vs no IV, and GBS recommendations (lab result pending) with pt  today.  Term labor symptoms and general obstetric precautions including but not limited to vaginal bleeding, contractions, leaking of fluid and fetal movement were reviewed in detail with the patient. Please refer to After Visit Summary for other counseling recommendations.  Return in about 2 days (around 03/29/2016) for NST only.   Hurshel PartyLisa A Leftwich-Kirby, CNM

## 2016-03-27 NOTE — Patient Instructions (Addendum)
Labor Precautions Reasons to come to MAU:  1.  Contractions are  5 minutes apart or less, each last 1 minute, these have been going on for 1-2 hours, and you cannot walk or talk during them 2.  You have a large gush of fluid, or a trickle of fluid that will not stop and you have to wear a pad 3.  You have bleeding that is bright red, heavier than spotting--like menstrual bleeding (spotting can be normal in early labor or after a check of your cervix) 4.  You do not feel the baby moving like he/she normally does    Try the [employer] for fetal position, try [email or web address], rest, walk daily.

## 2016-03-28 LAB — CULTURE, BETA STREP (GROUP B ONLY)

## 2016-03-29 ENCOUNTER — Ambulatory Visit (INDEPENDENT_AMBULATORY_CARE_PROVIDER_SITE_OTHER): Payer: Medicaid Other | Admitting: Obstetrics & Gynecology

## 2016-03-29 ENCOUNTER — Ambulatory Visit: Payer: Self-pay

## 2016-03-29 VITALS — BP 133/85 | HR 83

## 2016-03-29 DIAGNOSIS — O480 Post-term pregnancy: Secondary | ICD-10-CM | POA: Diagnosis present

## 2016-03-29 DIAGNOSIS — Z3689 Encounter for other specified antenatal screening: Secondary | ICD-10-CM | POA: Diagnosis not present

## 2016-03-29 NOTE — Progress Notes (Signed)
Pt informed that the ultrasound is considered a limited OB ultrasound and is not intended to be a complete ultrasound exam.  Patient also informed that the ultrasound is not being completed with the intent of assessing for fetal or placental anomalies or any pelvic abnormalities.  Explained that the purpose of today's ultrasound is to assess for presentation and amniotic fluid volume.  Patient acknowledges the purpose of the exam and the limitations of the study.    Pt declines IOL @ 41 wks as she wants to wait for spontaneous labor. She agrees to twice weekly fetal testing. Pt advised to return to hospital for sx of labor or decreased FM.  She voiced understanding.

## 2016-04-02 ENCOUNTER — Other Ambulatory Visit: Payer: Self-pay | Admitting: Student

## 2016-04-02 ENCOUNTER — Inpatient Hospital Stay (HOSPITAL_COMMUNITY)
Admission: AD | Admit: 2016-04-02 | Discharge: 2016-04-04 | DRG: 774 | Disposition: A | Discharge status: Home / Self Care | Payer: Medicaid Other | Source: Ambulatory Visit | Attending: Family Medicine | Admitting: Family Medicine

## 2016-04-02 ENCOUNTER — Encounter (HOSPITAL_COMMUNITY): Payer: Self-pay

## 2016-04-02 DIAGNOSIS — Z3A41 41 weeks gestation of pregnancy: Secondary | ICD-10-CM

## 2016-04-02 DIAGNOSIS — O9089 Other complications of the puerperium, not elsewhere classified: Secondary | ICD-10-CM | POA: Diagnosis not present

## 2016-04-02 DIAGNOSIS — R030 Elevated blood-pressure reading, without diagnosis of hypertension: Secondary | ICD-10-CM | POA: Diagnosis not present

## 2016-04-02 DIAGNOSIS — O480 Post-term pregnancy: Secondary | ICD-10-CM

## 2016-04-02 DIAGNOSIS — Z3493 Encounter for supervision of normal pregnancy, unspecified, third trimester: Secondary | ICD-10-CM | POA: Diagnosis present

## 2016-04-02 DIAGNOSIS — Z3483 Encounter for supervision of other normal pregnancy, third trimester: Secondary | ICD-10-CM

## 2016-04-02 LAB — CBC
HCT: 39.2 % (ref 36.0–46.0)
HEMOGLOBIN: 13.6 g/dL (ref 12.0–15.0)
MCH: 30.2 pg (ref 26.0–34.0)
MCHC: 34.7 g/dL (ref 30.0–36.0)
MCV: 87.1 fL (ref 78.0–100.0)
PLATELETS: 173 10*3/uL (ref 150–400)
RBC: 4.5 MIL/uL (ref 3.87–5.11)
RDW: 14.1 % (ref 11.5–15.5)
WBC: 23 10*3/uL — AB (ref 4.0–10.5)

## 2016-04-02 LAB — COMPREHENSIVE METABOLIC PANEL
ALT: 15 U/L (ref 14–54)
AST: 23 U/L (ref 15–41)
Albumin: 2.9 g/dL — ABNORMAL LOW (ref 3.5–5.0)
Alkaline Phosphatase: 138 U/L — ABNORMAL HIGH (ref 38–126)
Anion gap: 12 (ref 5–15)
BUN: 6 mg/dL (ref 6–20)
CHLORIDE: 102 mmol/L (ref 101–111)
CO2: 18 mmol/L — AB (ref 22–32)
CREATININE: 0.49 mg/dL (ref 0.44–1.00)
Calcium: 9.3 mg/dL (ref 8.9–10.3)
GFR calc Af Amer: >60 mL/min (ref >60)
Glucose, Bld: 121 mg/dL — ABNORMAL HIGH (ref 65–99)
Potassium: 4.3 mmol/L (ref 3.5–5.1)
Sodium: 132 mmol/L — ABNORMAL LOW (ref 135–145)
Total Bilirubin: 0.4 mg/dL (ref 0.3–1.2)
Total Protein: 6.6 g/dL (ref 6.5–8.1)

## 2016-04-02 LAB — RAPID URINE DRUG SCREEN, HOSP PERFORMED
AMPHETAMINES: NOT DETECTED
BARBITURATES: NOT DETECTED
BENZODIAZEPINES: NOT DETECTED
Cocaine: NOT DETECTED
Opiates: NOT DETECTED
TETRAHYDROCANNABINOL: NOT DETECTED

## 2016-04-02 LAB — PROTEIN / CREATININE RATIO, URINE
Creatinine, Urine: 57 mg/dL
Protein Creatinine Ratio: 0.26 mg/mg{Cre} — ABNORMAL HIGH (ref 0.00–0.15)
Total Protein, Urine: 15 mg/dL

## 2016-04-02 MED ORDER — OXYTOCIN 10 UNIT/ML IJ SOLN
INTRAMUSCULAR | Status: AC
  Filled 2016-04-02: qty 1

## 2016-04-02 MED ORDER — WITCH HAZEL-GLYCERIN EX PADS: 1.0000 "application " | MEDICATED_PAD | CUTANEOUS | Status: DC | PRN

## 2016-04-02 MED ORDER — ONDANSETRON HCL 4 MG/2ML IJ SOLN: 4.0000 mg | INTRAMUSCULAR | Status: DC | PRN

## 2016-04-02 MED ORDER — BENZOCAINE-MENTHOL 20-0.5 % EX AERO
1.0000 "application " | INHALATION_SPRAY | CUTANEOUS | Status: DC | PRN
  Filled 2016-04-02: qty 56

## 2016-04-02 MED ORDER — DIBUCAINE 1 % RE OINT: 1.0000 "application " | TOPICAL_OINTMENT | RECTAL | Status: DC | PRN

## 2016-04-02 MED ORDER — OXYCODONE-ACETAMINOPHEN 5-325 MG PO TABS: 1.0000 | ORAL_TABLET | ORAL | Status: DC | PRN

## 2016-04-02 MED ORDER — ACETAMINOPHEN 325 MG PO TABS: 650.0000 mg | ORAL_TABLET | ORAL | Status: DC | PRN

## 2016-04-02 MED ORDER — COCONUT OIL OIL
1.0000 "application " | TOPICAL_OIL | Status: DC | PRN
  Administered 2016-04-03: 1 via TOPICAL
  Filled 2016-04-02: qty 120

## 2016-04-02 MED ORDER — SENNOSIDES-DOCUSATE SODIUM 8.6-50 MG PO TABS
2.0000 | ORAL_TABLET | ORAL | Status: DC
  Administered 2016-04-02 – 2016-04-04 (×2): 2 via ORAL
  Filled 2016-04-02 (×2): qty 2

## 2016-04-02 MED ORDER — ONDANSETRON HCL 4 MG PO TABS: 4.0000 mg | ORAL_TABLET | ORAL | Status: DC | PRN

## 2016-04-02 MED ORDER — SOD CITRATE-CITRIC ACID 500-334 MG/5ML PO SOLN: 30.0000 mL | ORAL | Status: DC | PRN

## 2016-04-02 MED ORDER — LACTATED RINGERS IV SOLN: 500.0000 mL | INTRAVENOUS | Status: DC | PRN

## 2016-04-02 MED ORDER — PRENATAL MULTIVITAMIN CH
1.0000 | ORAL_TABLET | Freq: Every day | ORAL | Status: DC
  Administered 2016-04-03 – 2016-04-04 (×2): 1 via ORAL
  Filled 2016-04-02 (×2): qty 1

## 2016-04-02 MED ORDER — OXYTOCIN BOLUS FROM INFUSION: 500.0000 mL | Freq: Once | INTRAVENOUS | Status: DC

## 2016-04-02 MED ORDER — DIPHENHYDRAMINE HCL 25 MG PO CAPS: 25.0000 mg | ORAL_CAPSULE | Freq: Four times a day (QID) | ORAL | Status: DC | PRN

## 2016-04-02 MED ORDER — TETANUS-DIPHTH-ACELL PERTUSSIS 5-2.5-18.5 LF-MCG/0.5 IM SUSP: 0.5000 mL | Freq: Once | INTRAMUSCULAR | Status: DC

## 2016-04-02 MED ORDER — OXYCODONE-ACETAMINOPHEN 5-325 MG PO TABS: 2.0000 | ORAL_TABLET | ORAL | Status: DC | PRN

## 2016-04-02 MED ORDER — OXYTOCIN 10 UNIT/ML IJ SOLN
10.0000 [IU] | Freq: Once | INTRAMUSCULAR | Status: AC
  Administered 2016-04-02: 10 [IU] via INTRAMUSCULAR

## 2016-04-02 MED ORDER — FLEET ENEMA 7-19 GM/118ML RE ENEM: 1.0000 | ENEMA | RECTAL | Status: DC | PRN

## 2016-04-02 MED ORDER — ZOLPIDEM TARTRATE 5 MG PO TABS: 5.0000 mg | ORAL_TABLET | Freq: Every evening | ORAL | Status: DC | PRN

## 2016-04-02 MED ORDER — IBUPROFEN 600 MG PO TABS
600.0000 mg | ORAL_TABLET | Freq: Four times a day (QID) | ORAL | Status: DC
  Administered 2016-04-02 – 2016-04-04 (×8): 600 mg via ORAL
  Filled 2016-04-02 (×8): qty 1

## 2016-04-02 MED ORDER — LACTATED RINGERS IV SOLN: INTRAVENOUS | Status: DC

## 2016-04-02 MED ORDER — SIMETHICONE 80 MG PO CHEW: 80.0000 mg | CHEWABLE_TABLET | ORAL | Status: DC | PRN

## 2016-04-02 MED ORDER — FENTANYL CITRATE (PF) 100 MCG/2ML IJ SOLN: 100.0000 ug | INTRAMUSCULAR | Status: DC | PRN

## 2016-04-02 MED ORDER — LIDOCAINE HCL (PF) 1 % IJ SOLN
30.0000 mL | INTRAMUSCULAR | Status: DC | PRN
  Filled 2016-04-02: qty 30

## 2016-04-02 MED ORDER — ONDANSETRON HCL 4 MG/2ML IJ SOLN: 4.0000 mg | Freq: Four times a day (QID) | INTRAMUSCULAR | Status: DC | PRN

## 2016-04-02 MED ORDER — OXYTOCIN 40 UNITS IN LACTATED RINGERS INFUSION - SIMPLE MED: 2.5000 [IU]/h | INTRAVENOUS | Status: DC

## 2016-04-02 NOTE — Progress Notes (Signed)
Michele Weiss is a 31 y.o. [phone] at 5260w0d  admitted for spontaneous onset of labor   Subjective:  Coping well with contractions. No complaints at this time.  Objective: BP 125/82   Pulse 76   Temp 97.5 F (36.4 C) (Oral)   Resp 20  No intake/output data recorded. No intake/output data recorded.  FHT:  FHR: 130 bpm, variability: moderate,  accelerations:  Present,  decelerations:  Absent UC:   regular, every 4-5 minutes SVE:   Dilation: 7.5 Effacement (%): 80 Station: -1 Exam by:: Foye ClockS. Oklesh RN   Labs: Lab Results  Component Value Date   WBC 8.7 03/26/2016   HGB 11.5 (L) 03/26/2016   HCT 34.0 (L) 03/26/2016   MCV 87.4 03/26/2016   PLT 150 03/26/2016    Assessment / Plan: Spontaneous labor, progressing normally  Labor: Progressing normally Preeclampsia:  NA Fetal Wellbeing:  Category I Pain Control:  Labor support without medications I/D:  n/a Anticipated MOD:  NSVD  Tawnya CrookHogan, Letishia Elliott Donovan 04/02/2016, 10:02 AM

## 2016-04-02 NOTE — Anesthesia Pain Management Evaluation Note (Signed)
  CRNA Pain Management Visit Note  Patient: Michele Weiss, 31 y.o., female  "Hello I am a member of the anesthesia team at [facility]. We have an anesthesia team available at all times to provide care throughout the hospital, including epidural management and anesthesia for C-section. I don't know your plan for the delivery whether it a natural birth, water birth, IV sedation, nitrous supplementation, doula or epidural, but we want to meet your pain goals."   1.Was your pain managed to your expectations on prior hospitalizations?   Yes   2.What is your expectation for pain management during this hospitalization?     Labor support without medications  3.How can we help you reach that goal? Pt desires natural labor and has had 2 previous natural labors.  Record the patient's initial score and the patient's pain goal.   Pain: 5  Pain Goal: 10 [facility] wants you to be able to say your pain was always managed very well.  Tatsuo Musial 04/02/2016

## 2016-04-02 NOTE — Progress Notes (Signed)
RN called. Patient continues to have elevated blood pressure post-delivery. She denies any high blood pressure during pregnancy. She denies any HA, visual disturbances or RUQ pain.  Blood pressure: 135-145/90-101 Labs pending  Tawnya CrookHogan, Yul Diana Donovan  6:05 PM 04/02/16

## 2016-04-02 NOTE — Progress Notes (Signed)
Results for orders placed or performed during the hospital encounter of 04/02/16 (from the past 24 hour(s))  Urine rapid drug screen (hosp performed)     Status: None   Collection Time: 04/02/16 10:00 AM  Result Value Ref Range   Opiates NONE DETECTED NONE DETECTED   Cocaine NONE DETECTED NONE DETECTED   Benzodiazepines NONE DETECTED NONE DETECTED   Amphetamines NONE DETECTED NONE DETECTED   Tetrahydrocannabinol NONE DETECTED NONE DETECTED   Barbiturates NONE DETECTED NONE DETECTED  CBC     Status: Abnormal   Collection Time: 04/02/16  6:15 PM  Result Value Ref Range   WBC 23.0 (H) 4.0 - 10.5 K/uL   RBC 4.50 3.87 - 5.11 MIL/uL   Hemoglobin 13.6 12.0 - 15.0 g/dL   HCT 16.139.2 09.636.0 - 04.546.0 %   MCV 87.1 78.0 - 100.0 fL   MCH 30.2 26.0 - 34.0 pg   MCHC 34.7 30.0 - 36.0 g/dL   RDW 40.914.1 81.111.5 - 91.415.5 %   Platelets 173 150 - 400 K/uL  Comprehensive metabolic panel     Status: Abnormal   Collection Time: 04/02/16  6:15 PM  Result Value Ref Range   Sodium 132 (L) 135 - 145 mmol/L   Potassium 4.3 3.5 - 5.1 mmol/L   Chloride 102 101 - 111 mmol/L   CO2 18 (L) 22 - 32 mmol/L   Glucose, Bld 121 (H) 65 - 99 mg/dL   BUN 6 6 - 20 mg/dL   Creatinine, Ser 7.820.49 0.44 - 1.00 mg/dL   Calcium 9.3 8.9 - 95.610.3 mg/dL   Total Protein 6.6 6.5 - 8.1 g/dL   Albumin 2.9 (L) 3.5 - 5.0 g/dL   AST 23 15 - 41 U/L   ALT 15 14 - 54 U/L   Alkaline Phosphatase 138 (H) 38 - 126 U/L   Total Bilirubin 0.4 0.3 - 1.2 mg/dL   GFR calc non Af Amer >60 >60 mL/min   GFR calc Af Amer >60 >60 mL/min   Anion gap 12 5 - 15  Protein / creatinine ratio, urine     Status: Abnormal   Collection Time: 04/02/16  6:16 PM  Result Value Ref Range   Creatinine, Urine 57.00 mg/dL   Total Protein, Urine 15 mg/dL   Protein Creatinine Ratio 0.26 (H) 0.00 - 0.15 mg/mg[Cre]   Blood pressure remains 135-150/90-101 C/W Dr. Adrian BlackwaterStinson, [facility] with more frequent vital signs at this time.   Tawnya CrookHogan, Heather Donovan  7:27 PM 04/02/16

## 2016-04-02 NOTE — H&P (Signed)
LABOR AND DELIVERY ADMISSION HISTORY AND PHYSICAL NOTE  Michele Weiss is a 31 y.o. female [phone] with IUP at 10774w0d presenting for SOL.   She reports positive fetal movement. She denies leakage of fluid or vaginal bleeding.  Prenatal History/Complications: Poor prenatal care (partiallly in [place], stopped in Nov, [facility] last week)  Past Medical History: Past Medical History:  Diagnosis Date  . Medical history non-contributory     Past Surgical History: Past Surgical History:  Procedure Laterality Date  . OOPHORECTOMY Right 2017    Obstetrical History: OB History    Gravida Para Term Preterm AB Living   4 2 1   1 2    SAB TAB Ectopic Multiple Live Births       1   2      Social History: Social History   Social History  . Marital status: Single    Spouse name: N/A  . Number of children: N/A  . Years of education: N/A   Social History Main Topics  . Smoking status: Never Smoker  . Smokeless tobacco: Never Used  . Alcohol use Yes  . Drug use: No  . Sexual activity: Yes    Birth control/ protection: None   Other Topics Concern  . None   Social History Narrative  . None    Family History: History reviewed. No pertinent family history.  Allergies: No Known Allergies  No prescriptions prior to admission.     Review of Systems   All systems reviewed and negative except as stated in HPI  There were no vitals taken for this visit. General appearance: alert, cooperative, appears stated age and no distress Lungs: clear to auscultation bilaterally Heart: regular rate and rhythm Abdomen: soft, non-tender; bowel sounds normal Extremities: No calf swelling or tenderness Presentation: cephalic Fetal monitoring: 130 bpm, mod var, +accels, no decels Uterine activity: Irregular, difficult to pick up on monitor, in room about every 3 min Dilation: 5 Effacement (%): 80 Station: -1 Exam by:: A. Stovall RN   Prenatal labs: ABO, Rh:  --/--/A POS (02/20 2012) Antibody: NEG (02/20 2011) Rubella: !Error! NON-IMMUNE RPR: Non Reactive (02/20 2011)  HBsAg: Negative (02/20 2011)  HIV:   NR GBS:   NEG 1 hr Glucola: Per patient, not diabetic, had testing done in [place] Genetic screening:  Unknown if received Anatomy US: Normal  Prenatal Transfer Tool  Maternal Diabetes: No Genetic Screening: Declined Maternal Ultrasounds/Referrals: Normal Fetal Ultrasounds or other Referrals:  None Maternal Substance Abuse:  No Significant Maternal Medications:  None Significant Maternal Lab Results: Lab values include: Group B Strep negative, Other:  Rubellla non-immune  No results found for this or any previous visit (from the past 24 hour(s)).  Patient Active Problem List   Diagnosis Date Noted  . Supervision of normal pregnancy in third trimester 03/27/2016  . FIBROADENOSIS, BREAST 04/03/2006    Assessment: Michele Weiss is a 31 y.o. G4P1012 at 5674w0d here for SOL.   #Labor: SOL, requesting natural labor without intervention #Pain: No intervention/pain meds/epidural #FWB: Cat I #ID:  GBS Neg #MOF: Breast #MOC: Natural family planning #Circ:  Possibly if boy  Jen MowElizabeth Mumaw, DO OB Fellow [facility] 04/02/2016, 5:36 AM

## 2016-04-02 NOTE — MAU Note (Signed)
Pt presents complaining of contractions since 2am. Denies leaking or bleeding. Reports good fetal movement.

## 2016-04-03 NOTE — Plan of Care (Signed)
Problem: Activity: Goal: Ability to tolerate increased activity will improve Outcome: Completed/Met Date Met: 04/03/16 Patient able to ambulate without staff assistance  Problem: Nutritional: Goal: Dietary intake will improve Outcome: Completed/Met Date Met: 04/03/16 Tolerating regular food  Problem: Urinary Elimination: Goal: Ability to reestablish a normal urinary elimination pattern will improve Outcome: Completed/Met Date Met: 04/03/16 Pt voiding adequately

## 2016-04-03 NOTE — Progress Notes (Signed)
Post Partum Day 1 Subjective: no complaints, up ad lib, voiding and tolerating PO  Objective: Blood pressure 112/78, pulse 86, temperature 98.1 F (36.7 C), temperature source Oral, resp. rate 18, SpO2 99 %, unknown if currently breastfeeding.  Physical Exam:  General: alert Lochia: appropriate Uterine Fundus: firm and NT at U-2 DVT Evaluation: No evidence of DVT seen on physical exam.   Recent Labs  04/02/16 1815  HGB 13.6  HCT 39.2    Assessment/Plan: Plan for discharge tomorrow  She declines contraception and will follow up at the clinic.   LOS: 1 day   Allie BossierMyra C Jaya Lapka 04/03/2016, 6:46 AM

## 2016-04-03 NOTE — Plan of Care (Signed)
Problem: Health Behavior/Discharge Planning: Goal: Ability to manage health-related needs will improve Outcome: Completed/Met Date Met: 04/03/16 Pt was in [place] for the last year. Moved back to [place] to be with family. FOB came to visit yesterday. "Was a very good visit" per Pt.

## 2016-04-03 NOTE — Discharge Instructions (Signed)

## 2016-04-03 NOTE — Progress Notes (Signed)
CSW acknowledged consult and completed a clinical assessment.  There are no barriers to d/c and CSW will monitor infant's UDS and CDS.  CSW will make a report to [place] CPS if needed.    Clinical assessment notes will be entered at a later time.  Edie Darley Boyd-Gilyard, MSW, LCSW Clinical Social Work [phone] 

## 2016-04-03 NOTE — Lactation Note (Addendum)
This note was copied from a baby's chart. Lactation Consultation Note  Patient Name: Michele Rosaria FerriesKrystal Packham ZOXWR'UToday's Date: 04/03/2016 Reason for consult: Initial assessment   With this mom of a term baby, now 923 hours old. Mom is an experienced breast feeder, and states she loves breastfeeding. She said she is "having trouble getting him to open his mouth wide enough". Her nipple appears pinched after a feeding. I assisted mom with latching in football hold, and using an asymmetrical latch. He appears deep, with good breast movement, but at times his latch is very painful., at other time, "not too bad" He is stooling and voiding WNL, and mom stated his stool is now brown in color, Basic breast feeding done from the Mom and baby Care book and lactation services reviewed. Mom aware of o/p consults, and was advised to make one at discharge, if latch still painful.  Mom agreed to trying a 24 nipple shield. The baby was still pinching, and was having trouble latching deeply with shield. Mom knows to call for questions/conerns. Faculty MD made aware of above.    Maternal Data    Feeding Feeding Type: Breast Fed Length of feed: 27 min (still latched when I left the room)  LATCH Score/Interventions Latch: Repeated attempts needed to sustain latch, nipple held in mouth throughout feeding, stimulation needed to elicit sucking reflex. (baby pinching mom's nipple) Intervention(s): Adjust position;Assist with latch  Audible Swallowing: A few with stimulation Intervention(s): Skin to skin;Hand expression  Type of Nipple: Everted at rest and after stimulation  Comfort (Breast/Nipple): Filling, red/small blisters or bruises, mild/mod discomfort (mom feels pinching from top of latach ( upper lip), more than lower latch)  Problem noted: Mild/Moderate discomfort  Hold (Positioning): Assistance needed to correctly position infant at breast and maintain latch. Intervention(s): Breastfeeding basics reviewed;Support  Pillows;Position options;Skin to skin  LATCH Score: 6  Lactation Tools Discussed/Used     Consult Status Consult Status: Follow-up Date: 04/04/16 Follow-up type: In-patient    Alfred LevinsLee, Feven Alderfer Anne 04/03/2016, 4:30 PM

## 2016-04-03 NOTE — Lactation Note (Signed)
This note was copied from a baby's chart. Lactation Consultation Note Mom sleeping soundly. Patient Name: Michele Weiss ZOXWR'UToday's Date: 04/03/2016     Maternal Data    Feeding    LATCH Score/Interventions                      Lactation Tools Discussed/Used     Consult Status      [name], Diamond NickelLAURA G 04/03/2016, 12:10 AM

## 2016-04-04 LAB — RPR: RPR Ser Ql: NONREACTIVE

## 2016-04-04 MED ORDER — IBUPROFEN 600 MG PO TABS: 600.0000 mg | ORAL_TABLET | Freq: Four times a day (QID) | ORAL | 0 refills | Status: DC

## 2016-04-04 NOTE — Discharge Summary (Signed)
Obstetric Discharge Summary Reason for Admission: SOL at [redacted] weeks EGA Prenatal Procedures: ultrasound Intrapartum Procedures: spontaneous vaginal delivery Postpartum Procedures: none Complications-Operative and Postpartum: 1st degree perineal laceration Hemoglobin  Date Value Ref Range Status  04/02/2016 13.6 12.0 - 15.0 g/dL Final   HCT  Date Value Ref Range Status  04/02/2016 39.2 36.0 - 46.0 % Final    Physical Exam:  General: alert Lochia: appropriate Uterine Fundus: firm DVT Evaluation: No evidence of DVT seen on physical exam.  Discharge Diagnoses: Term Pregnancy-delivered  Discharge Information: Date: 04/04/2016 Activity: pelvic rest Diet: routine Medications: PNV and Ibuprofen Condition: stable Instructions: refer to practice specific booklet Discharge to: home  She declines contraception at this point. Follow-up Information    Sharen CounterLisa Leftwich-Kirby, CNM. Schedule an appointment as soon as possible for a visit in 6 week(s).   Specialty:  Obstetrics and Gynecology Contact information: [address] [place] [address] [phone]        Dorathy KinsmanVirginia Smith, CNM. Schedule an appointment as soon as possible for a visit in 6 week(s).   Specialty:  Obstetrics and Gynecology Contact information: [address] [place] [address] [phone]           Newborn Data: Live born female  Birth Weight: 7 lb 15.5 oz (3615 g) APGAR: 9, 9  Home with mother.  Michele BossierMyra C Amory Weiss 04/04/2016, 6:44 AM

## 2016-04-04 NOTE — Lactation Note (Signed)
This note was copied from a baby's chart. Lactation Consultation Note  Patient Name: Michele Rosaria FerriesKrystal Ivey AVWUJ'WToday's Date: 04/04/2016 Reason for consult: Follow-up assessment;Breast/nipple pain  Visited with Mom on day of discharge, baby 6642 hrs old.  Baby at 6% weight loss, with adequate output. Mom states baby is feeding every 2 hrs, sometimes going to sleep after 5 minutes.  She desires that baby sometimes doesn't get depth on the breast, and would like assistance this morning.  Nipples erect, without any trauma noted.  Mom using coconut oil.  Assisted with hand expression, a small dot noted on nipple.  Baby cueing vigorously, and positioned baby in football hold.  Mom needing some guidance in positioning.  Baby latched on, and lower chin tugged to open baby's mouth wider while latched.  Baby's mouth relaxed and easy to adjust.  Mom wincing with pain at initial latch, but pain decreases after a few minutes.  Taught Mom to use alternate breast compression, and to relax while baby is nursing as best she can.  Baby fed with occasional swallows identified.  After baby came off first couple times, colostrum very easily expressed, and nipple only slightly pinched.  Baby appears to be latched deeply onto breast.  Encouraged Mom to feed baby skin to skin, and feed often on cue (8-12 times per 24 hrs).  Mom has a pump at home ([employer]), and asked about pumping and starting to store milk.  Encouraged her to feed baby more often, and pump when breasts feel full.   Offered OP lactation appointment to follow up on painful latch.  04/09/16 @ 2:30pm.  Mom to cancel if latch improves.   Encouraged Mom to call prn.      Consult Status Consult Status: Follow-up Date: 04/08/16 Follow-up type: Out-patient    Judee ClaraSmith, Allure Greaser E 04/04/2016, 11:04 AM

## 2016-04-04 NOTE — Clinical Social Work Maternal (Signed)
  CLINICAL SOCIAL WORK MATERNAL/CHILD NOTE  Patient Details  Name: Michele Weiss MRN: [id number] Date of Birth: 10/01/1985  Date:  04/04/2016  Clinical Social Worker Initiating Note:  Michele Weiss Date/ Time Initiated:  04/04/16/0934     Child's Name:  name unknown   Legal Guardian:  Mother (Michele Weiss 12/19/1975)   Need for Interpreter:  None   Date of Referral:  04/03/16     Reason for Referral:  Late or No Prenatal Care    Referral Source:  Central Nursery   Address:  [address] [place] [address]  Phone number:  [phone]   Household Members:  Self, Minor Children, Parents   Natural Supports (not living in the home):  Immediate Family, Spouse/significant other   Professional Supports: None   Employment: Unemployed   Type of Work:     Education:  High school graduate   Financial Resources:  Medicaid   Other Resources:  Food Stamps  ([name] was given information to apply for WIC.)   Cultural/Religious Considerations Which May Impact Care:  none reported  Strengths:  Ability to meet basic needs , Home prepared for child    Risk Factors/Current Problems:  None   Cognitive State:  Alert , Able to Concentrate , Linear Thinking , Insightful    Mood/Affect:  Interested , Comfortable , Anxious , Calm , Tearful    CSW Assessment: CSW met with [name] to complete an assessment for limited/NPNC.  When CSW arrived, [name] was resting in bed engaging in skin to skin with infant.  [name] appeared comfortable with handling infant and infant appeared peaceful. CSW inquired about [name]'s supports and [name] reported that [name] will be supported by [name]'s mother and immediate family.  [name] stated that FOB left on yesterday to return back to [place] where he resides. [name] stated that [name]'s mother is currently caring for [name]'s oldest children while [name] is inpatient. [name] emphasized that [name]'s mother is the main source of support for the family. CSW asked about [name]'s limited PNCNPNC  and [name] reported that [name] received PNC in [place] prior to moving to [place].  [name] communicated that [name] has signed documents to have [name]'s medical records released from [place] to [facility].  CSW informed [name] that [facility] has not received [name]'s medical records and informed [name] of the hospital's policy regarding NPNC. [name] did not have any questions regarding the policy and denied the use of any substance. CSW shared with [name] that CSW will continue to monitor the infant's UDS and CDS and will make a report to [place] CPS if needed. [name] had numerous questions about CPS investigation and CSW answered [name]'s questions. [name] denied a hx of CPS involvement. [name] has denied HI,SI, and DV.    CSW educated [name] about PPD. CSW informed [name] of possible supports and interventions to decrease PPD.  CSW also encouraged [name] to seek medical attention if needed for increased signs and symptoms for PPD.   CSW Plan/Description:  Information/Referral to Community Resources , Patient/Family Education , No Further Intervention Required/No Barriers to Discharge (CSW will monitor infant's UDS and CDS and will make a report if warranted. )   Michele Weiss, MSW, LCSW Clinical Social Work [phone]   Michele Basilio D BOYD-GILYARD, LCSW 04/04/2016, 10:31 AM 

## 2016-04-11 ENCOUNTER — Ambulatory Visit: Payer: Self-pay

## 2016-04-11 NOTE — Lactation Note (Signed)
This note was copied from a baby's chart. Lactation Consult - [name] and mom Michele Weiss present for 10:30 am LC O/P appt.  Per mom reason for visit - help with latching so it won't hurt  Exclusively breast feeding at the breast on demand and every 2-3 hours  For 10 -20 mins. Wets 6-8 per day , stools 3-5 yellow  Pumping only after am feeding 2 oz each pumping  No bottles or supplementing needed.  Experienced breast feeder of 2 others 1st 3 months , 2nd 6 months  Visit type - Feeding assessment  Appointment notes - Some pinching with latch - follow -up for sore nipples  Referral done, confirmed appt. 04/10/16  Consult:  Initial Lactation Consultant:  Matilde Sprang Jonahtan Manseau  ________________________________________________________________________ Joan Flores Name:  Michele Weiss Date of Birth:  04/02/2014 [facility] - per mom is in search and Pedis group that doesn't  Mandate route vaccines . Per mom will take the baby back at 2 weeks for weight check only  Gender:  female Gestational Age: [redacted]w[redacted]d (At Birth) Birth Weight:  7 lb 15.5 oz (3615 g) Weight at Discharge:  7-8 oz                         Date of Discharge:  3/1  There were no vitals filed for this visit. Last weight taken from location outside of [facility]: 7-11 oz 3/3 per mom     Location:Pediatrician's office Weight today:  8-5.2 oz 3776 g    ________________________________________________________________________  Mother's Name: Michele Weiss Type of delivery:  Vaginal, Spontaneous Delivery Breastfeeding Experience:  3 rd baby  Maternal Medical Conditions:  No risk  Maternal Medications: PNV   ________________________________________________________________________  Breastfeeding History (Post Discharge) see note above  ______________________________________________________________________  Maternal Breast Assessment  Breast:  Full Nipple:  Flat, compressible areolas - and  hand expressed and nipple more erect and areola more compressible for a deeper latch  Pain level:  6 - decreased to 3 , and then intermittent  Pain interventions:  Expressed breast milk  _______________________________________________________________________ Feeding Assessment/Evaluation  Initial feeding assessment:  Infant's oral assessment:  High palate, strong suck on LC's gloved finger, extends tongue over the gum line   Mom assisted with latch and depth , Latch score 8 , nutritive feeding pattern and sustained a latch for 15 - 20 mins. Mom started at a scale of 6 and quickly decreased to a scale of 3 and then intermittent.   Pre-feed weight:  8-5.2 oz , 37776 g  Stool change  Re - weigh: 8-4.9 oz 3768 g  Post-feed weight: 8-6.1 oz , 3802 g  Amount transferred: 34 ml  Amount supplemented: none needed   Wet and stool diaper  Changed Baby fed latched on the right breast with depth and assist for depth , sustained latch for 10 -15 mins , nipple well rounded when baby released. And mom  More comfortable then the other breast.  Re- weight:  Pre- feed weight: 3784 g , 8-5.5 oz  Post - Feed weight 3810 g , 8-6.4 oz Amount transferred: 26 ml  Additional Feeding Assessment -  RE-latched on the other breast - latched with depth, mom more comfortable,  Nutritive sucking pattern , depth achieved.   Pre-feed weight:  3810 g , 8-6.4 oz  Post-feed weight:  3834 g , 8-7. 2 oz  Amount transferred: 24 ml  Amount supplemented: none needed  Total amount pumped post feed: both breast softened - not needed   Total amount transferred: 84 ml ( excellent for 9 day old baby  Total supplement given:  None needed  Lactation plan of Care:  Praised mom for her breast feeding efforts Sore nipple  EBM to nipples liberally  Comfort gels x6 days once opened  Prior to feeding 1sy breast - express down 10 -15 ml , reverse pressure ( as shown )  Latch with breast compressions until  swallows Important - areola needs to be compressible like a sandwich prior to latch  Feedings - with feeding cues . And at least 8 x a day every 2-3 hours  Growth spurt - cluster feeding normal - 7-10 days , 3 , 6 weeks , rapid growth Free weight check - [facility] group - Monday at 7 pm and Tuesday at 11am

## 2016-05-14 ENCOUNTER — Ambulatory Visit: Payer: Self-pay | Admitting: Advanced Practice Midwife

## 2018-02-20 ENCOUNTER — Emergency Department (HOSPITAL_COMMUNITY): Payer: No Typology Code available for payment source

## 2018-02-20 ENCOUNTER — Emergency Department (HOSPITAL_COMMUNITY)
Admission: EM | Admit: 2018-02-20 | Discharge: 2018-02-21 | Disposition: A | Discharge status: Home / Self Care | Payer: No Typology Code available for payment source | Attending: Emergency Medicine | Admitting: Emergency Medicine

## 2018-02-20 ENCOUNTER — Encounter (HOSPITAL_COMMUNITY): Payer: Self-pay | Admitting: Emergency Medicine

## 2018-02-20 DIAGNOSIS — Z79899 Other long term (current) drug therapy: Secondary | ICD-10-CM | POA: Diagnosis not present

## 2018-02-20 DIAGNOSIS — Y999 Unspecified external cause status: Secondary | ICD-10-CM | POA: Insufficient documentation

## 2018-02-20 DIAGNOSIS — S82041A Displaced comminuted fracture of right patella, initial encounter for closed fracture: Secondary | ICD-10-CM | POA: Insufficient documentation

## 2018-02-20 DIAGNOSIS — S8991XA Unspecified injury of right lower leg, initial encounter: Secondary | ICD-10-CM | POA: Diagnosis present

## 2018-02-20 DIAGNOSIS — S81011A Laceration without foreign body, right knee, initial encounter: Secondary | ICD-10-CM | POA: Diagnosis not present

## 2018-02-20 DIAGNOSIS — Y939 Activity, unspecified: Secondary | ICD-10-CM | POA: Insufficient documentation

## 2018-02-20 DIAGNOSIS — Y929 Unspecified place or not applicable: Secondary | ICD-10-CM | POA: Diagnosis not present

## 2018-02-20 LAB — I-STAT BETA HCG BLOOD, ED (MC, WL, AP ONLY): I-stat hCG, quantitative: <5 m[IU]/mL (ref <5)

## 2018-02-20 MED ORDER — OXYCODONE-ACETAMINOPHEN 5-325 MG PO TABS: 1.0000 | ORAL_TABLET | Freq: Once | ORAL | Status: DC

## 2018-02-20 MED ORDER — LIDOCAINE-EPINEPHRINE-TETRACAINE (LET) SOLUTION
3.0000 mL | Freq: Once | NASAL | Status: AC
  Administered 2018-02-20: 3 mL via TOPICAL
  Filled 2018-02-20: qty 3

## 2018-02-20 MED ORDER — LIDOCAINE-EPINEPHRINE (PF) 2 %-1:200000 IJ SOLN
20.0000 mL | Freq: Once | INTRAMUSCULAR | Status: AC
  Administered 2018-02-20: 20 mL
  Filled 2018-02-20: qty 20

## 2018-02-20 MED ORDER — DOXYCYCLINE HYCLATE 100 MG PO TABS
100.0000 mg | ORAL_TABLET | Freq: Once | ORAL | Status: AC
  Administered 2018-02-20: 100 mg via ORAL
  Filled 2018-02-20: qty 1

## 2018-02-20 MED ORDER — OXYCODONE-ACETAMINOPHEN 5-325 MG PO TABS: 1.0000 | ORAL_TABLET | Freq: Three times a day (TID) | ORAL | 0 refills | Status: DC | PRN

## 2018-02-20 MED ORDER — DIAZEPAM 5 MG PO TABS
5.0000 mg | ORAL_TABLET | Freq: Once | ORAL | Status: AC
  Administered 2018-02-21: 5 mg via ORAL
  Filled 2018-02-20: qty 1

## 2018-02-20 MED ORDER — BACITRACIN ZINC 500 UNIT/GM EX OINT
TOPICAL_OINTMENT | Freq: Once | CUTANEOUS | Status: AC
  Administered 2018-02-20: 22:00:00 via TOPICAL
  Filled 2018-02-20: qty 0.9

## 2018-02-20 MED ORDER — OXYCODONE-ACETAMINOPHEN 5-325 MG PO TABS
1.0000 | ORAL_TABLET | Freq: Once | ORAL | Status: AC
  Administered 2018-02-20: 1 via ORAL
  Filled 2018-02-20: qty 1

## 2018-02-20 MED ORDER — IBUPROFEN 200 MG PO TABS
600.0000 mg | ORAL_TABLET | Freq: Once | ORAL | Status: AC
  Administered 2018-02-21: 600 mg via ORAL
  Filled 2018-02-20: qty 3

## 2018-02-20 MED ORDER — DOXYCYCLINE HYCLATE 100 MG PO CAPS: 100.0000 mg | ORAL_CAPSULE | Freq: Two times a day (BID) | ORAL | 0 refills | Status: DC
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
## 2018-02-20 NOTE — Discharge Instructions (Addendum)
You will need to monitor your infant for potential GI side effects such as diarrhea or drowsiness when taking these medications and breast-feeding. It is important for you to discard your first batch of breastmilk after taking the medication prior to breast-feeding. Follow-up with the orthopedist listed below for further evaluation and surgery. You can take Tylenol as needed for pain as well.  Please be careful with the amount of Tylenol as a pain medicine I am giving you also has Tylenol in it.   Return to ED for worsening symptoms, signs of infection including redness, drainage, increased swelling or fever.
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
## 2018-02-20 NOTE — ED Notes (Signed)
Pt is very weepy and concerned about possible pain when wound is cleaned. PA at bedside.

## 2018-02-21 MED ORDER — DIAZEPAM 5 MG PO TABS: 5.0000 mg | ORAL_TABLET | Freq: Two times a day (BID) | ORAL | 0 refills | Status: DC

## 2018-02-21 NOTE — ED Notes (Signed)
Pt demonstrated the ability to walk with crutches with her knee immobilizer in place.

## 2018-02-23 ENCOUNTER — Other Ambulatory Visit: Payer: Self-pay

## 2018-02-23 ENCOUNTER — Emergency Department (HOSPITAL_COMMUNITY): Payer: Self-pay

## 2018-02-23 ENCOUNTER — Emergency Department (HOSPITAL_COMMUNITY)
Admission: EM | Admit: 2018-02-23 | Discharge: 2018-02-23 | Disposition: A | Discharge status: Home / Self Care | Payer: Self-pay | Attending: Emergency Medicine | Admitting: Emergency Medicine

## 2018-02-23 ENCOUNTER — Encounter (HOSPITAL_COMMUNITY): Payer: Self-pay

## 2018-02-23 DIAGNOSIS — R0789 Other chest pain: Secondary | ICD-10-CM | POA: Insufficient documentation

## 2018-02-23 DIAGNOSIS — R0602 Shortness of breath: Secondary | ICD-10-CM

## 2018-02-23 LAB — LIPASE, BLOOD: LIPASE: 25 U/L (ref 11–51)

## 2018-02-23 LAB — CBC WITH DIFFERENTIAL/PLATELET
Abs Immature Granulocytes: 0.03 10*3/uL (ref 0.00–0.07)
Basophils Absolute: 0.1 10*3/uL (ref 0.0–0.1)
Basophils Relative: 1 %
EOS ABS: 0.7 10*3/uL — AB (ref 0.0–0.5)
Eosinophils Relative: 9 %
HCT: 39.6 % (ref 36.0–46.0)
Hemoglobin: 12.9 g/dL (ref 12.0–15.0)
Immature Granulocytes: 0 %
Lymphocytes Relative: 22 %
Lymphs Abs: 1.7 10*3/uL (ref 0.7–4.0)
MCH: 30.6 pg (ref 26.0–34.0)
MCHC: 32.6 g/dL (ref 30.0–36.0)
MCV: 94.1 fL (ref 80.0–100.0)
Monocytes Absolute: 0.7 10*3/uL (ref 0.1–1.0)
Monocytes Relative: 9 %
NEUTROS PCT: 59 %
Neutro Abs: 4.5 10*3/uL (ref 1.7–7.7)
Platelets: 210 10*3/uL (ref 150–400)
RBC: 4.21 MIL/uL (ref 3.87–5.11)
RDW: 12.3 % (ref 11.5–15.5)
WBC: 7.6 10*3/uL (ref 4.0–10.5)
nRBC: 0 % (ref 0.0–0.2)

## 2018-02-23 LAB — COMPREHENSIVE METABOLIC PANEL
ALT: 17 U/L (ref 0–44)
AST: 22 U/L (ref 15–41)
Albumin: 3.3 g/dL — ABNORMAL LOW (ref 3.5–5.0)
Alkaline Phosphatase: 45 U/L (ref 38–126)
Anion gap: 8 (ref 5–15)
BUN: 12 mg/dL (ref 6–20)
CO2: 22 mmol/L (ref 22–32)
Calcium: 8.4 mg/dL — ABNORMAL LOW (ref 8.9–10.3)
Chloride: 105 mmol/L (ref 98–111)
Creatinine, Ser: 0.6 mg/dL (ref 0.44–1.00)
GFR calc Af Amer: >60 mL/min (ref >60)
Glucose, Bld: 94 mg/dL (ref 70–99)
POTASSIUM: 3.5 mmol/L (ref 3.5–5.1)
Sodium: 135 mmol/L (ref 135–145)
Total Bilirubin: 1 mg/dL (ref 0.3–1.2)
Total Protein: 6.1 g/dL — ABNORMAL LOW (ref 6.5–8.1)

## 2018-02-23 MED ORDER — KETOROLAC TROMETHAMINE 30 MG/ML IJ SOLN
30.0000 mg | Freq: Once | INTRAMUSCULAR | Status: AC
  Administered 2018-02-23: 30 mg via INTRAVENOUS
  Filled 2018-02-23: qty 1

## 2018-02-23 MED ORDER — SODIUM CHLORIDE (PF) 0.9 % IJ SOLN
INTRAMUSCULAR | Status: AC
  Filled 2018-02-23: qty 50

## 2018-02-23 MED ORDER — METHOCARBAMOL 500 MG PO TABS: 500.0000 mg | ORAL_TABLET | Freq: Two times a day (BID) | ORAL | 0 refills | Status: DC

## 2018-02-23 MED ORDER — IOPAMIDOL (ISOVUE-370) INJECTION 76%
INTRAVENOUS | Status: AC
  Filled 2018-02-23: qty 100

## 2018-02-23 MED ORDER — SODIUM CHLORIDE 0.9 % IV SOLN: INTRAVENOUS | Status: DC

## 2018-02-23 MED ORDER — MORPHINE SULFATE (PF) 4 MG/ML IV SOLN
4.0000 mg | Freq: Once | INTRAVENOUS | Status: AC
  Administered 2018-02-23: 4 mg via INTRAVENOUS
  Filled 2018-02-23: qty 1

## 2018-02-23 MED ORDER — IOPAMIDOL (ISOVUE-370) INJECTION 76%
100.0000 mL | Freq: Once | INTRAVENOUS | Status: AC | PRN
  Administered 2018-02-23: 100 mL via INTRAVENOUS

## 2018-02-23 MED ORDER — OXYCODONE-ACETAMINOPHEN 5-325 MG PO TABS: 2.0000 | ORAL_TABLET | ORAL | 0 refills | Status: DC | PRN

## 2018-02-23 NOTE — ED Triage Notes (Addendum)
Patient presents with central chest pain, starting last night. Patient reports the pain feels "burning, like reflux." Patient reports having recently been diagnosed with an "open knee fracture" for which she was prescribed percocet, valium, and an antibiotic. Patient reports taking the percocet last night, which helped with her pain and let her sleep. Patient reports she woke up with the pain, and states she took her scheduled antibiotic, valium, and ibuprofen. Patient reports having little appetite recently due to the pain. Patient denies pain radiation. Patient reports SOB with pain.
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
## 2018-02-25 ENCOUNTER — Ambulatory Visit (INDEPENDENT_AMBULATORY_CARE_PROVIDER_SITE_OTHER): Payer: Self-pay | Admitting: Orthopaedic Surgery

## 2018-02-25 ENCOUNTER — Other Ambulatory Visit: Payer: Self-pay

## 2018-02-25 ENCOUNTER — Encounter (HOSPITAL_COMMUNITY): Payer: Self-pay | Admitting: *Deleted

## 2018-02-25 ENCOUNTER — Encounter (INDEPENDENT_AMBULATORY_CARE_PROVIDER_SITE_OTHER): Payer: Self-pay | Admitting: Orthopaedic Surgery

## 2018-02-25 DIAGNOSIS — S82041A Displaced comminuted fracture of right patella, initial encounter for closed fracture: Secondary | ICD-10-CM
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
## 2018-02-26 ENCOUNTER — Other Ambulatory Visit (INDEPENDENT_AMBULATORY_CARE_PROVIDER_SITE_OTHER): Payer: Self-pay | Admitting: Physician Assistant

## 2018-02-26 NOTE — Progress Notes (Signed)
Need orders in epic.  Surgery on 02/27/18

## 2018-02-27 ENCOUNTER — Ambulatory Visit (HOSPITAL_COMMUNITY): Payer: Self-pay | Admitting: Certified Registered Nurse Anesthetist

## 2018-02-27 ENCOUNTER — Ambulatory Visit (HOSPITAL_COMMUNITY): Payer: Self-pay

## 2018-02-27 ENCOUNTER — Other Ambulatory Visit: Payer: Self-pay

## 2018-02-27 ENCOUNTER — Encounter (HOSPITAL_COMMUNITY)
Admission: RE | Disposition: A | Discharge status: Home Health | Payer: Self-pay | Source: Home / Self Care | Attending: Orthopaedic Surgery

## 2018-02-27 ENCOUNTER — Encounter (HOSPITAL_COMMUNITY): Payer: Self-pay | Admitting: *Deleted

## 2018-02-27 ENCOUNTER — Observation Stay (HOSPITAL_COMMUNITY)
Admission: RE | Admit: 2018-02-27 | Discharge: 2018-03-02 | Disposition: A | Discharge status: Home Health | Payer: Self-pay | Attending: Orthopaedic Surgery | Admitting: Orthopaedic Surgery

## 2018-02-27 DIAGNOSIS — Z419 Encounter for procedure for purposes other than remedying health state, unspecified: Secondary | ICD-10-CM

## 2018-02-27 DIAGNOSIS — S82041A Displaced comminuted fracture of right patella, initial encounter for closed fracture: Principal | ICD-10-CM | POA: Diagnosis present

## 2018-02-27 DIAGNOSIS — S82001A Unspecified fracture of right patella, initial encounter for closed fracture: Secondary | ICD-10-CM | POA: Diagnosis present

## 2018-02-27 HISTORY — DX: Pneumonia, unspecified organism: J18.9

## 2018-02-27 HISTORY — PX: ORIF PATELLA: SHX5033

## 2018-02-27 SURGERY — OPEN REDUCTION INTERNAL FIXATION (ORIF) PATELLA
Anesthesia: Regional | Site: Knee | Laterality: Right

## 2018-02-27 MED ORDER — HYDROMORPHONE HCL 1 MG/ML IJ SOLN
INTRAMUSCULAR | Status: AC
  Filled 2018-02-27: qty 1

## 2018-02-27 MED ORDER — FENTANYL CITRATE (PF) 100 MCG/2ML IJ SOLN
50.0000 ug | INTRAMUSCULAR | Status: DC
  Administered 2018-02-27: 100 ug via INTRAVENOUS
  Filled 2018-02-27: qty 2

## 2018-02-27 MED ORDER — OXYCODONE HCL 5 MG PO TABS
10.0000 mg | ORAL_TABLET | ORAL | Status: DC | PRN
  Administered 2018-02-27 – 2018-02-28 (×3): 15 mg via ORAL
  Administered 2018-02-28: 10 mg via ORAL
  Administered 2018-02-28: 15 mg via ORAL
  Administered 2018-02-28: 10 mg via ORAL
  Administered 2018-03-01 (×3): 15 mg via ORAL
  Administered 2018-03-01: 10 mg via ORAL
  Administered 2018-03-01 – 2018-03-02 (×5): 15 mg via ORAL
  Filled 2018-02-27 (×4): qty 3
  Filled 2018-02-27 (×2): qty 2
  Filled 2018-02-27 (×8): qty 3

## 2018-02-27 MED ORDER — DOCUSATE SODIUM 100 MG PO CAPS
100.0000 mg | ORAL_CAPSULE | Freq: Two times a day (BID) | ORAL | Status: DC
  Administered 2018-02-27 – 2018-03-02 (×6): 100 mg via ORAL
  Filled 2018-02-27 (×6): qty 1

## 2018-02-27 MED ORDER — DEXAMETHASONE SODIUM PHOSPHATE 10 MG/ML IJ SOLN
INTRAMUSCULAR | Status: AC
  Filled 2018-02-27: qty 1

## 2018-02-27 MED ORDER — HYDROMORPHONE HCL 1 MG/ML IJ SOLN
0.5000 mg | INTRAMUSCULAR | Status: DC | PRN
  Administered 2018-02-28 (×4): 0.5 mg via INTRAVENOUS
  Filled 2018-02-27 (×4): qty 1

## 2018-02-27 MED ORDER — DIPHENHYDRAMINE HCL 12.5 MG/5ML PO ELIX: 12.5000 mg | ORAL_SOLUTION | ORAL | Status: DC | PRN

## 2018-02-27 MED ORDER — LACTATED RINGERS IV SOLN
INTRAVENOUS | Status: DC
  Administered 2018-02-27 (×2): via INTRAVENOUS

## 2018-02-27 MED ORDER — OXYCODONE HCL 5 MG/5ML PO SOLN
5.0000 mg | Freq: Once | ORAL | Status: DC | PRN
  Filled 2018-02-27: qty 5

## 2018-02-27 MED ORDER — MIDAZOLAM HCL 2 MG/2ML IJ SOLN
1.0000 mg | INTRAMUSCULAR | Status: DC
  Administered 2018-02-27: 2 mg via INTRAVENOUS
  Filled 2018-02-27: qty 2

## 2018-02-27 MED ORDER — LIDOCAINE 2% (20 MG/ML) 5 ML SYRINGE
INTRAMUSCULAR | Status: DC | PRN
  Administered 2018-02-27: 60 mg via INTRAVENOUS

## 2018-02-27 MED ORDER — CEFAZOLIN SODIUM-DEXTROSE 1-4 GM/50ML-% IV SOLN
1.0000 g | Freq: Four times a day (QID) | INTRAVENOUS | Status: AC
  Administered 2018-02-27 – 2018-02-28 (×3): 1 g via INTRAVENOUS
  Filled 2018-02-27 (×3): qty 50

## 2018-02-27 MED ORDER — CHLORHEXIDINE GLUCONATE 4 % EX LIQD
60.0000 mL | Freq: Once | CUTANEOUS | Status: AC
  Administered 2018-02-27: 4 via TOPICAL

## 2018-02-27 MED ORDER — METOCLOPRAMIDE HCL 5 MG/ML IJ SOLN: 5.0000 mg | Freq: Three times a day (TID) | INTRAMUSCULAR | Status: DC | PRN

## 2018-02-27 MED ORDER — CEFAZOLIN SODIUM-DEXTROSE 2-4 GM/100ML-% IV SOLN
2.0000 g | INTRAVENOUS | Status: AC
  Administered 2018-02-27: 2 g via INTRAVENOUS
  Filled 2018-02-27: qty 100

## 2018-02-27 MED ORDER — HYDROMORPHONE HCL 1 MG/ML IJ SOLN
0.2500 mg | INTRAMUSCULAR | Status: DC | PRN
  Administered 2018-02-27: 0.25 mg via INTRAVENOUS
  Administered 2018-02-27: 0.5 mg via INTRAVENOUS
  Administered 2018-02-27: 0.25 mg via INTRAVENOUS
  Administered 2018-02-27 (×2): 0.5 mg via INTRAVENOUS

## 2018-02-27 MED ORDER — SODIUM CHLORIDE 0.9 % IV SOLN
INTRAVENOUS | Status: DC
  Administered 2018-02-27 – 2018-02-28 (×2): via INTRAVENOUS

## 2018-02-27 MED ORDER — OXYCODONE HCL 5 MG PO TABS: 5.0000 mg | ORAL_TABLET | Freq: Once | ORAL | Status: DC | PRN

## 2018-02-27 MED ORDER — ONDANSETRON HCL 4 MG/2ML IJ SOLN: 4.0000 mg | Freq: Four times a day (QID) | INTRAMUSCULAR | Status: DC | PRN

## 2018-02-27 MED ORDER — CLONIDINE HCL (ANALGESIA) 100 MCG/ML EP SOLN
EPIDURAL | Status: DC | PRN
  Administered 2018-02-27: 100 ug

## 2018-02-27 MED ORDER — DEXAMETHASONE SODIUM PHOSPHATE 10 MG/ML IJ SOLN
INTRAMUSCULAR | Status: DC | PRN
  Administered 2018-02-27: 10 mg via INTRAVENOUS

## 2018-02-27 MED ORDER — SODIUM CHLORIDE 0.9 % IR SOLN
Status: DC | PRN
  Administered 2018-02-27: 1000 mL

## 2018-02-27 MED ORDER — ONDANSETRON HCL 4 MG/2ML IJ SOLN
INTRAMUSCULAR | Status: DC | PRN
  Administered 2018-02-27: 4 mg via INTRAVENOUS

## 2018-02-27 MED ORDER — METHOCARBAMOL 500 MG PO TABS
500.0000 mg | ORAL_TABLET | Freq: Four times a day (QID) | ORAL | Status: DC | PRN
  Administered 2018-02-28 – 2018-03-02 (×10): 500 mg via ORAL
  Filled 2018-02-27 (×11): qty 1

## 2018-02-27 MED ORDER — METHOCARBAMOL 500 MG IVPB - SIMPLE MED
INTRAVENOUS | Status: AC
  Filled 2018-02-27: qty 50

## 2018-02-27 MED ORDER — ROPIVACAINE HCL 5 MG/ML IJ SOLN
INTRAMUSCULAR | Status: DC | PRN
  Administered 2018-02-27: 30 mL via PERINEURAL

## 2018-02-27 MED ORDER — FENTANYL CITRATE (PF) 100 MCG/2ML IJ SOLN
INTRAMUSCULAR | Status: AC
  Filled 2018-02-27: qty 2

## 2018-02-27 MED ORDER — 0.9 % SODIUM CHLORIDE (POUR BTL) OPTIME
TOPICAL | Status: DC | PRN
  Administered 2018-02-27: 1000 mL

## 2018-02-27 MED ORDER — ONDANSETRON HCL 4 MG/2ML IJ SOLN
INTRAMUSCULAR | Status: AC
  Filled 2018-02-27: qty 2

## 2018-02-27 MED ORDER — METOCLOPRAMIDE HCL 5 MG PO TABS: 5.0000 mg | ORAL_TABLET | Freq: Three times a day (TID) | ORAL | Status: DC | PRN

## 2018-02-27 MED ORDER — LIDOCAINE 2% (20 MG/ML) 5 ML SYRINGE
INTRAMUSCULAR | Status: AC
  Filled 2018-02-27: qty 5

## 2018-02-27 MED ORDER — ONDANSETRON HCL 4 MG PO TABS: 4.0000 mg | ORAL_TABLET | Freq: Four times a day (QID) | ORAL | Status: DC | PRN

## 2018-02-27 MED ORDER — ACETAMINOPHEN 325 MG PO TABS
325.0000 mg | ORAL_TABLET | Freq: Four times a day (QID) | ORAL | Status: DC | PRN
  Administered 2018-02-28 (×3): 650 mg via ORAL
  Filled 2018-02-27 (×3): qty 2

## 2018-02-27 MED ORDER — PROPOFOL 10 MG/ML IV BOLUS
INTRAVENOUS | Status: DC | PRN
  Administered 2018-02-27: 180 mg via INTRAVENOUS

## 2018-02-27 MED ORDER — PROMETHAZINE HCL 25 MG/ML IJ SOLN: 6.2500 mg | INTRAMUSCULAR | Status: DC | PRN

## 2018-02-27 MED ORDER — ACETAMINOPHEN 500 MG PO TABS
1000.0000 mg | ORAL_TABLET | Freq: Once | ORAL | Status: AC
  Administered 2018-02-27: 1000 mg via ORAL
  Filled 2018-02-27: qty 2

## 2018-02-27 MED ORDER — METHOCARBAMOL 500 MG IVPB - SIMPLE MED
500.0000 mg | Freq: Four times a day (QID) | INTRAVENOUS | Status: DC | PRN
  Administered 2018-02-27: 500 mg via INTRAVENOUS
  Filled 2018-02-27: qty 50

## 2018-02-27 MED ORDER — FENTANYL CITRATE (PF) 100 MCG/2ML IJ SOLN
INTRAMUSCULAR | Status: DC | PRN
  Administered 2018-02-27 (×3): 50 ug via INTRAVENOUS

## 2018-02-27 MED ORDER — OXYCODONE HCL 5 MG PO TABS
5.0000 mg | ORAL_TABLET | ORAL | Status: DC | PRN
  Administered 2018-03-01: 5 mg via ORAL
  Filled 2018-02-27: qty 1
  Filled 2018-02-27: qty 2

## 2018-02-27 SURGICAL SUPPLY — 60 items
BAG SPEC THK2 15X12 ZIP CLS (MISCELLANEOUS)
BAG ZIPLOCK 12X15 (MISCELLANEOUS) IMPLANT
BANDAGE ACE 6X5 VEL STRL LF (GAUZE/BANDAGES/DRESSINGS) ×3 IMPLANT
BANDAGE ELASTIC 6 VELCRO ST LF (GAUZE/BANDAGES/DRESSINGS) ×2 IMPLANT
BANDAGE ESMARK 6X9 LF (GAUZE/BANDAGES/DRESSINGS) ×1 IMPLANT
BIT DRILL CANN 2.7 (BIT) ×2
BIT DRILL CANN 2.7MM (BIT) ×1
BIT DRILL SRG 2.7XCANN AO CPLG (BIT) IMPLANT
BIT DRL SRG 2.7XCANN AO CPLNG (BIT) ×1
BNDG CMPR 9X6 STRL LF SNTH (GAUZE/BANDAGES/DRESSINGS) ×1
BNDG ESMARK 6X9 LF (GAUZE/BANDAGES/DRESSINGS) ×3
COVER SURGICAL LIGHT HANDLE (MISCELLANEOUS) ×3 IMPLANT
COVER WAND RF STERILE (DRAPES) IMPLANT
CUFF TOURN SGL QUICK 34 (TOURNIQUET CUFF) ×3
CUFF TRNQT CYL 34X4X40X1 (TOURNIQUET CUFF) ×1 IMPLANT
DRAPE C-ARM 42X120 X-RAY (DRAPES) ×2 IMPLANT
DRAPE C-ARMOR (DRAPES) ×2 IMPLANT
DRAPE EXTREMITY T 121X128X90 (DISPOSABLE) ×3 IMPLANT
DRAPE SHEET LG 3/4 BI-LAMINATE (DRAPES) IMPLANT
DRAPE U-SHAPE 47X51 STRL (DRAPES) ×3 IMPLANT
DRSG PAD ABDOMINAL 8X10 ST (GAUZE/BANDAGES/DRESSINGS) ×1 IMPLANT
DURAPREP 26ML APPLICATOR (WOUND CARE) ×1 IMPLANT
ELECT REM PT RETURN 15FT ADLT (MISCELLANEOUS) ×3 IMPLANT
GAUZE SPONGE 4X4 12PLY STRL (GAUZE/BANDAGES/DRESSINGS) ×3 IMPLANT
GAUZE XEROFORM 1X8 LF (GAUZE/BANDAGES/DRESSINGS) ×3 IMPLANT
GLOVE BIO SURGEON STRL SZ7.5 (GLOVE) ×3 IMPLANT
GLOVE BIOGEL PI IND STRL 8 (GLOVE) ×2 IMPLANT
GLOVE BIOGEL PI INDICATOR 8 (GLOVE) ×4
GLOVE ECLIPSE 8.0 STRL XLNG CF (GLOVE) ×3 IMPLANT
GOWN STRL REUS W/TWL XL LVL3 (GOWN DISPOSABLE) ×6 IMPLANT
GUIDE WIRE UNTHRD 1.4X150 (Wire) ×9 IMPLANT
GUIDEWIRE UNTHRD 1.4X150 (Wire) ×3 IMPLANT
IMMOBILIZER KNEE 20 (SOFTGOODS)
IMMOBILIZER KNEE 20 THIGH 36 (SOFTGOODS) ×1 IMPLANT
IMPL SCREW FT 4.0X34 (Screw) IMPLANT
IMPLANT SCREW FT 4.0X34 (Screw) ×3 IMPLANT
KIT BASIN OR (CUSTOM PROCEDURE TRAY) ×3 IMPLANT
KNEE SCREW FT 4.0X30 (Miscellaneous) ×2 IMPLANT
PACK TOTAL JOINT (CUSTOM PROCEDURE TRAY) ×3 IMPLANT
PAD ABD 8X10 STRL (GAUZE/BANDAGES/DRESSINGS) ×2 IMPLANT
PADDING CAST COTTON 6X4 STRL (CAST SUPPLIES) ×3 IMPLANT
PASSER SUT SWANSON 36MM LOOP (INSTRUMENTS) ×3 IMPLANT
PROTECTOR NERVE ULNAR (MISCELLANEOUS) ×3 IMPLANT
SCREW CANNULATED 4.0X38MM (Screw) ×2 IMPLANT
SCREW CANNULATED TI 4.0X44 (Screw) IMPLANT
SOL PREP POV-IOD 16OZ 10% (MISCELLANEOUS) ×2 IMPLANT
STAPLER VISISTAT 35W (STAPLE) ×1 IMPLANT
SUCTION FRAZIER HANDLE 10FR (MISCELLANEOUS) ×2
SUCTION TUBE FRAZIER 10FR DISP (MISCELLANEOUS) ×1 IMPLANT
SUT ETHIBOND NAB CT1 #1 30IN (SUTURE) ×2 IMPLANT
SUT ETHILON 3 0 PS 1 (SUTURE) ×6 IMPLANT
SUT FIBERWIRE #2 38 T-5 BLUE (SUTURE) ×6
SUT VIC AB 0 CT1 36 (SUTURE) ×2 IMPLANT
SUT VIC AB 1 CT1 27 (SUTURE) ×3
SUT VIC AB 1 CT1 27XBRD ANTBC (SUTURE) ×1 IMPLANT
SUT VIC AB 2-0 CT1 27 (SUTURE) ×6
SUT VIC AB 2-0 CT1 TAPERPNT 27 (SUTURE) ×2 IMPLANT
SUTURE FIBERWR #2 38 T-5 BLUE (SUTURE) ×2 IMPLANT
TOWEL OR 17X26 10 PK STRL BLUE (TOWEL DISPOSABLE) ×3 IMPLANT
UNDERPAD 30X30 (UNDERPADS AND DIAPERS) ×2 IMPLANT

## 2018-02-27 NOTE — Anesthesia Postprocedure Evaluation (Signed)
Anesthesia Post Note  Patient: Michele Weiss  Procedure(s) Performed: OPEN REDUCTION INTERNAL (ORIF) FIXATION RIGHT PATELLA (Right Knee)     Patient location during evaluation: PACU Anesthesia Type: Regional and General Level of consciousness: awake and alert Pain management: pain level controlled Vital Signs Assessment: post-procedure vital signs reviewed and stable Respiratory status: spontaneous breathing, nonlabored ventilation, respiratory function stable and patient connected to nasal cannula oxygen Cardiovascular status: blood pressure returned to baseline and stable Postop Assessment: no apparent nausea or vomiting Anesthetic complications: no    Last Vitals:  Vitals:   02/27/18 1755 02/27/18 1809  BP: 116/77 112/75  Pulse: 100 100  Resp: 12 14  Temp: 37.2 C 36.9 C  SpO2: 100% 100%    Last Pain:  Vitals:   02/27/18 1810  TempSrc:   PainSc: 7                  Ryan P Ellender

## 2018-02-27 NOTE — Transfer of Care (Signed)
Immediate Anesthesia Transfer of Care Note  Patient: Michele Weiss  Procedure(s) Performed: OPEN REDUCTION INTERNAL (ORIF) FIXATION RIGHT PATELLA (Right Knee)  Patient Location: PACU  Anesthesia Type:General and GA combined with regional for post-op pain  Level of Consciousness: drowsy  Airway & Oxygen Therapy: Patient Spontanous Breathing and Patient connected to face mask oxygen  Post-op Assessment: Report given to RN and Post -op Vital signs reviewed and stable  Post vital signs: Reviewed and stable  Last Vitals:  Vitals Value Taken Time  BP 120/76 02/27/2018  4:54 PM  Temp    Pulse 108 02/27/2018  4:55 PM  Resp 17 02/27/2018  4:55 PM  SpO2 100 % 02/27/2018  4:55 PM  Vitals shown include unvalidated device data.  Last Pain:  Vitals:   02/27/18 1210  TempSrc:   PainSc: 6       Patients Stated Pain Goal: 5 (02/27/18 1210)  Complications: No apparent anesthesia complications

## 2018-02-27 NOTE — Brief Op Note (Signed)
02/27/2018  4:17 PM  PATIENT:  Michele Weiss  33 y.o. female  PRE-OPERATIVE DIAGNOSIS:  right patella fracture  POST-OPERATIVE DIAGNOSIS:  right patella fracture  PROCEDURE:  Procedure(s): OPEN REDUCTION INTERNAL (ORIF) FIXATION RIGHT PATELLA (Right)  SURGEON:  Surgeon(s) and Role:    Kathryne Hitch, MD - Primary  PHYSICIAN ASSISTANT:  Rexene Edison, PA-C  ANESTHESIA:   regional and general  COUNTS:  YES  TOURNIQUET:  * Missing tourniquet times found for documented tourniquets in log: [id number] *  DICTATION: .Other Dictation: Dictation Number [id number]  PLAN OF CARE: Admit to inpatient   PATIENT DISPOSITION:  PACU - hemodynamically stable.   Delay start of Pharmacological VTE agent (>24hrs) due to surgical blood loss or risk of bleeding: no

## 2018-02-27 NOTE — Anesthesia Procedure Notes (Signed)
Anesthesia Regional Block: Adductor canal block   Pre-Anesthetic Checklist: ,, timeout performed, Correct Patient, Correct Site, Correct Laterality, Correct Procedure,, site marked, risks and benefits discussed, Surgical consent,  Pre-op evaluation,  At surgeon's request and post-op pain management  Laterality: Right  Prep: chloraprep       Needles:  Injection technique: Single-shot  Needle Type: Echogenic Stimulator Needle     Needle Length: 9cm  Needle Gauge: 21     Additional Needles:   Procedures:,,,, ultrasound used (permanent image in chart),,,,  Narrative:  Start time: 02/27/2018 2:10 PM End time: 02/27/2018 2:20 PM Injection made incrementally with aspirations every 5 mL.  Performed by: Personally  Anesthesiologist: Leonides Grills, MD  Additional Notes: Functioning IV was confirmed and monitors were applied. A time-out was performed. Hand hygiene and sterile gloves were used. The thigh was placed in a frog-leg position and prepped in a sterile fashion. A 45mm 21ga Arrow echogenic stimulator needle was placed using ultrasound guidance.  Negative aspiration and negative test dose prior to incremental administration of local anesthetic. The patient tolerated the procedure well.

## 2018-02-27 NOTE — Anesthesia Preprocedure Evaluation (Addendum)
Anesthesia Evaluation  Patient identified by MRN, date of birth, ID band Patient awake    Reviewed: Allergy & Precautions, NPO status , Patient's Chart, lab work & pertinent test results  Airway Mallampati: II  TM Distance: >3 FB Neck ROM: Full    Dental no notable dental hx.    Pulmonary neg pulmonary ROS,    Pulmonary exam normal breath sounds clear to auscultation       Cardiovascular negative cardio ROS Normal cardiovascular exam Rhythm:Regular Rate:Normal  ECG: SR, rate 98   Neuro/Psych negative neurological ROS  negative psych ROS   GI/Hepatic negative GI ROS, Neg liver ROS,   Endo/Other  negative endocrine ROS  Renal/GU negative Renal ROS     Musculoskeletal negative musculoskeletal ROS (+)   Abdominal   Peds  Hematology negative hematology ROS (+)   Anesthesia Other Findings Right patella fracture  Reproductive/Obstetrics hcg negative                             Anesthesia Physical Anesthesia Plan  ASA: II  Anesthesia Plan: Regional and General   Post-op Pain Management:    Induction:   PONV Risk Score and Plan: 2 and Ondansetron, Dexamethasone, Midazolam and Treatment may vary due to age or medical condition  Airway Management Planned: LMA  Additional Equipment:   Intra-op Plan:   Post-operative Plan: Extubation in OR  Informed Consent: I have reviewed the patients History and Physical, chart, labs and discussed the procedure including the risks, benefits and alternatives for the proposed anesthesia with the patient or authorized representative who has indicated his/her understanding and acceptance.     Dental advisory given  Plan Discussed with: CRNA  Anesthesia Plan Comments: (M-M: acetaminophen and dexamethasone)       Anesthesia Quick Evaluation
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
## 2018-02-27 NOTE — Progress Notes (Signed)
AssistedDr. Ellender with right, ultrasound guided, adductor canal block. Side rails up, monitors on throughout procedure. See vital signs in flow sheet. Tolerated Procedure well.  
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
## 2018-02-27 NOTE — Progress Notes (Signed)
Orthopedic Tech Progress Note Patient Details:  Michele Weiss [birth year] [id number] [name] has been called and brace ordered. Patient ID: [name], female   DOB: Jun 21, 1985, 33 y.o.   MRN: [id number]   [name] [facility] 02/27/2018, 10:06 PM

## 2018-02-27 NOTE — Anesthesia Procedure Notes (Signed)
Procedure Name: LMA Insertion Date/Time: 02/27/2018 2:43 PM Performed by: [place], Millie Shorb C, CRNA Pre-anesthesia Checklist: Patient identified, Emergency Drugs available, Suction available and Patient being monitored Patient Re-evaluated:Patient Re-evaluated prior to induction Oxygen Delivery Method: Circle system utilized Preoxygenation: Pre-oxygenation with 100% oxygen Induction Type: IV induction Ventilation: Mask ventilation without difficulty LMA: LMA inserted LMA Size: 4.0 Number of attempts: 1 Airway Equipment and Method: Bite block Placement Confirmation: positive ETCO2 Tube secured with: Tape Dental Injury: Teeth and Oropharynx as per pre-operative assessment

## 2018-02-28 MED ORDER — METHOCARBAMOL 500 MG PO TABS: 500.0000 mg | ORAL_TABLET | Freq: Four times a day (QID) | ORAL | 0 refills | Status: DC | PRN

## 2018-02-28 MED ORDER — OXYCODONE HCL 5 MG PO TABS: 5.0000 mg | ORAL_TABLET | ORAL | 0 refills | Status: DC | PRN

## 2018-02-28 MED ORDER — FLUCONAZOLE 150 MG PO TABS
150.0000 mg | ORAL_TABLET | Freq: Once | ORAL | Status: AC
  Administered 2018-02-28: 150 mg via ORAL
  Filled 2018-02-28: qty 1

## 2018-02-28 NOTE — Progress Notes (Signed)
Dr. Magnus Ivan notified of need for yeast infection treatment for pt. Orders obtained. He was also notified of pt poor mobility with physical therapy.

## 2018-02-28 NOTE — Plan of Care (Signed)
Pt remains stable overall though pain remains an issue. RN continues to educate pt on need for balance for safety and sedation of pain medications. No s/s of distress. Rn medicating for needs. Pt seems anxious overall and ends up in tears in most conversations with nursing staff.

## 2018-02-28 NOTE — Progress Notes (Signed)
Patient refused to get out of bed to the bedside commode, stating she would get up to the bedside commode the next time she needed the toilet.

## 2018-02-28 NOTE — Progress Notes (Signed)
Bledsoe brace applied to right leg by Biomedical scientist. Pt did not tolerate this well due to pain. rn did medicate pt with iv pain meds after the brace was placed. Pt overall has been difficult to manage with pain. She gets very sleepy overall with pain meds and rn is concerned for pt breathing with too many pain meds.
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
## 2018-02-28 NOTE — Progress Notes (Signed)
Subjective: Patient stable.  Pain moderate to severe at this time.  She has not been out of bed.  Brace currently being fitted to the right leg   Objective: Vital signs in last 24 hours: Temp:  [97.5 F (36.4 C)-99 F (37.2 C)] 98.1 F (36.7 C) (01/25 0501) Pulse Rate:  [59-119] 59 (01/25 0501) Resp:  [9-25] 14 (01/25 0501) BP: (104-123)/(67-89) 107/67 (01/25 0501) SpO2:  [100 %] 100 % (01/24 2124) Weight:  [60.3 kg] 60.3 kg (01/24 1119)  Intake/Output from previous day: 01/24 0701 - 01/25 0700 In: 1880 [P.O.:480; I.V.:1400] Out: 620 [Urine:600; Blood:20] Intake/Output this shift: Total I/O In: 240 [P.O.:240] Out: -   Exam:  Dorsiflexion/Plantar flexion intact Compartment soft  Labs: No results for input(s): HGB in the last 72 hours. No results for input(s): WBC, RBC, HCT, PLT in the last 72 hours. No results for input(s): NA, K, CL, CO2, BUN, CREATININE, GLUCOSE, CALCIUM in the last 72 hours. No results for input(s): LABPT, INR in the last 72 hours.  Assessment/Plan: Plan at this time is to mobilize with therapy.  Bledsoe brace in extension is being placed at this time.  She may require another day of hospitalization because of support issues at home.  Tentative plan at this time is for her to stay today and possibly go home tomorrow once she is able to get around a little bit better.   G Scott Dean 02/28/2018, 9:03 AM

## 2018-03-01 NOTE — Care Management Note (Signed)
Case Management Note  Patient Details  Name: Michele Weiss MRN: [id number] Date of Birth: [birth year]  Subjective/Objective:      S/p ORIF R patella fx              Action/Plan: NCM spoke to pt and states she lives with her mother. States she has crutches from ED. [facility] delivered RW and 3n1 bedside commode to her room. [facility] is agency that will provided [facility] for pt without insurance coverage. Contacted [facility] with new referral. Will need HH order with [facility].   Expected Discharge Date:                Expected Discharge Plan:  Home w Home Health Services  In-House Referral:  NA  Discharge planning Services  CM Consult  Post Acute Care Choice:  Home Health Choice offered to:  Patient  DME Arranged:  3-N-1, Walker rolling DME Agency:  [facility]  HH Arranged:  PT HH Agency:  [facility]  Status of Service:  Completed, signed off  If discussed at Long Length of Stay Meetings, dates discussed:    Additional Comments:  Elliot Cousin, RN 03/01/2018, 11:53 AM

## 2018-03-01 NOTE — Progress Notes (Signed)
Patient suffers from R patella fracture which impairs their ability to perform daily activities like walking in the home.  A walker alone will not resolve the issues with performing activities of daily living. A lightweight wheelchair with elevating leg rests will allow patient to safely perform daily activities.  The patient can self propel in the home or has a caregiver who can provide assistance.     Ralene Bathe Kistler PT 03/01/2018  Acute Rehabilitation Services Pager [phone] Office [phone]

## 2018-03-01 NOTE — Progress Notes (Addendum)
Physical Therapy Treatment Patient Details Name: Michele Weiss MRN: [id number] DOB: 11-20-1985 Today's Date: 03/01/2018    History of Present Illness Pt s/p ORIF R patella fx    PT Comments    Increased time for all mobility, much encouragement and frequent rest breaks required throughout entire session. Sit to stand with much assist and increased time to lower RLE to floor. Pt did take 5 steps forward and 3 steps backward with crutches to transfer to recliner. Pt is progressing slowly.    Follow Up Recommendations  Home health PT     Equipment Recommendations  Rolling walker with 5" wheels;3in1 (PT); wheelchair with elevating leg rests    Recommendations for Other Services OT consult     Precautions / Restrictions Precautions Precautions: Fall Required Braces or Orthoses: Other Brace Other Brace: Bledsoe brace R LE at all times Restrictions Weight Bearing Restrictions: No Other Position/Activity Restrictions: WBAT    Mobility  Bed Mobility Overal bed mobility: Needs Assistance Bed Mobility: Supine to Sit     Supine to sit: +2 for physical assistance;+2 for safety/equipment;Min assist     General bed mobility comments: Increased time 2* anxiety with step by step instruction and contant reassurance  Transfers Overall transfer level: Needs assistance Equipment used: Crutches Transfers: Sit to/from Stand Sit to Stand: +2 physical assistance;+2 safety/equipment;From elevated surface;Min assist         General transfer comment: Increased time with step by step cues and constant encouragement; sit to stand from elevated bed with RLE resting on overturned trashcan, pt then very gradually lowered RLE to floor with assist and frequent rest breaks  Ambulation/Gait Ambulation/Gait assistance: Min assist;+2 safety/equipment Gait Distance (Feet): 4 Feet Assistive device: Crutches Gait Pattern/deviations: Step-to pattern     General Gait Details: pt took 5 steps  forward and 3 steps backward with crutches, increased time 2* pain/anxiety, long rest periods between each step   Stairs             Wheelchair Mobility    Modified Rankin (Stroke Patients Only)       Balance Overall balance assessment: Needs assistance Sitting-balance support: No upper extremity supported;Feet supported Sitting balance-Leahy Scale: Good     Standing balance support: Bilateral upper extremity supported Standing balance-Leahy Scale: Poor Standing balance comment: Pt very reliant on RW for support                            Cognition Arousal/Alertness: Awake/alert Behavior During Therapy: [facility] for tasks assessed/performed;Anxious Overall Cognitive Status: Within Functional Limits for tasks assessed                                        Exercises General Exercises - Lower Extremity Ankle Circles/Pumps: AROM;Both;10 reps;Supine    General Comments        Pertinent Vitals/Pain Faces Pain Scale: Hurts whole lot Pain Location: R knee Pain Descriptors / Indicators: Sore;Operative site guarding;Guarding;Grimacing;Other (Comment) Pain Intervention(s): Limited activity within patient's tolerance;Monitored during session;Premedicated before session;Ice applied    Home Living                      Prior Function            PT Goals (current goals can now be found in the care plan section) Acute Rehab PT Goals Patient Stated Goal: Less pain;  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, works part time as Producer, television/film/video PT Goal Formulation: With patient Time For Goal Achievement: 03/14/18 Potential to Achieve Goals: Fair Progress towards PT goals: Progressing toward goals    Frequency    Min 6X/week      PT Plan Current plan remains appropriate    Co-evaluation              AM-PAC PT "6 Clicks" Mobility   Outcome Measure  Help needed turning from your back to your side while in a flat bed without using bedrails?: A  Lot Help needed moving from lying on your back to sitting on the side of a flat bed without using bedrails?: A Lot Help needed moving to and from a bed to a chair (including a wheelchair)?: A Lot Help needed standing up from a chair using your arms (e.g., wheelchair or bedside chair)?: A Lot Help needed to walk in hospital room?: A Lot Help needed climbing 3-5 steps with a railing? : Total 6 Click Score: 11    End of Session Equipment Utilized During Treatment: Gait belt Activity Tolerance: Patient limited by pain;Patient limited by fatigue;Other (comment)(anxiety level) Patient left: with call bell/phone within reach;in chair Nurse Communication: Mobility status PT Visit Diagnosis: Difficulty in walking, not elsewhere classified (R26.2);Pain Pain - Right/Left: Right Pain - part of body: Knee     Time: 0922-1003 PT Time Calculation (min) (ACUTE ONLY): 41 min  Charges:  $Gait Training: 8-22 mins $Therapeutic Activity: 23-37 mins                    Ralene Bathe Kistler PT 03/01/2018  Acute Rehabilitation Services Pager [phone] Office [phone]

## 2018-03-01 NOTE — Progress Notes (Signed)
Subjective: Patient stable.  Very apprehensive about moving in about pain.  She is requesting a dressing change today.   Objective: Vital signs in last 24 hours: Temp:  [97.8 F (36.6 C)-99 F (37.2 C)] 97.8 F (36.6 C) (01/26 0430) Pulse Rate:  [64-95] 95 (01/26 0430) Resp:  [16] 16 (01/26 0430) BP: (121-140)/(75-97) 128/93 (01/26 0430) SpO2:  [100 %] 100 % (01/26 0430)  Intake/Output from previous day: 01/25 0701 - 01/26 0700 In: 1542.3 [P.O.:960; I.V.:482.3; IV Piggyback:100] Out: 2650 [Urine:2650] Intake/Output this shift: Total I/O In: -  Out: 300 [Urine:300]  Exam:  Sensation intact distally Dorsiflexion/Plantar flexion intact No cellulitis present  Labs: No results for input(s): HGB in the last 72 hours. No results for input(s): WBC, RBC, HCT, PLT in the last 72 hours. No results for input(s): NA, K, CL, CO2, BUN, CREATININE, GLUCOSE, CALCIUM in the last 72 hours. No results for input(s): LABPT, INR in the last 72 hours.  Assessment/Plan: Plan at this time is Aquasol dressing placed over the incision which looks good.  1 Ace wrap applied.  She has Bledsoe brace keeping the leg straight.  She has been slow to mobilize.  I do not really see her going home today.  She may be a tomorrow discharge.   Marrianne Mood Mehreen Azizi 03/01/2018, 9:45 AM

## 2018-03-02 NOTE — Progress Notes (Signed)
Physical Therapy Treatment Patient Details Name: Michele DodgeKrystal A Nasby MRN: [id number] DOB: Dec 11, 1985 Today's Date: 03/02/2018    History of Present Illness Pt s/p ORIF R patella fx    PT Comments    Pt ambulated 22' with RW with increased time, pt keeps RLE in NWB position. Encouraged pt to attempt TTWB on RLE, she's not yet able to do so. Pt is slowly progressing with mobility. Will plan to do stair training this afternoon.    Follow Up Recommendations  Home health PT     Equipment Recommendations  Rolling walker with 5" wheels;3in1 (PT);Wheelchair (measurements PT);Wheelchair cushion (measurements PT)    Recommendations for Other Services OT consult     Precautions / Restrictions Precautions Precautions: Fall Required Braces or Orthoses: Other Brace Other Brace: Bledsoe brace R LE at all times Restrictions Weight Bearing Restrictions: No Other Position/Activity Restrictions: WBAT    Mobility  Bed Mobility Overal bed mobility: Needs Assistance Bed Mobility: Supine to Sit;Sit to Supine     Supine to sit: Min assist Sit to supine: Min assist   General bed mobility comments: min A to support RLE in and out of bed, instructed pt to self assist RLE with LLE or to use gait belt to lift leg  Transfers Overall transfer level: Needs assistance Equipment used: Rolling walker (2 wheeled) Transfers: Sit to/from Stand Sit to Stand: Min assist         General transfer comment: VCs hand placement, increased time, min A to lower RLE to floor (pt was sitting on BSC with RLE elevated on bed)  Ambulation/Gait Ambulation/Gait assistance: Min guard Gait Distance (Feet): 22 Feet Assistive device: Rolling walker (2 wheeled) Gait Pattern/deviations: Step-to pattern;Decreased weight shift to right Gait velocity: decreased   General Gait Details: VCs sequencing, pt mostly holds RLE in NWB position, encouraged pt to attempt putting a little weight through RLE, increased time for all  mobility but progressing   Stairs             Wheelchair Mobility    Modified Rankin (Stroke Patients Only)       Balance Overall balance assessment: Needs assistance Sitting-balance support: No upper extremity supported;Feet supported Sitting balance-Leahy Scale: Good     Standing balance support: Bilateral upper extremity supported Standing balance-Leahy Scale: Poor Standing balance comment: Pt very reliant on RW for support                            Cognition Arousal/Alertness: Awake/alert Behavior During Therapy: [facility] HospitalWFL for tasks assessed/performed;Anxious Overall Cognitive Status: Within Functional Limits for tasks assessed                                 General Comments: anxiety present but improved from yesterday      Exercises      General Comments        Pertinent Vitals/Pain Pain Assessment: 0-10 Pain Score: 7  Pain Location: R knee Pain Descriptors / Indicators: Sore;Operative site guarding;Guarding;Grimacing;Other (Comment) Pain Intervention(s): Limited activity within patient's tolerance;Monitored during session;Premedicated before session;Patient requesting pain meds-RN notified;RN gave pain meds during session;Ice applied    Home Living                      Prior Function            PT Goals (current goals can now be found in the  care plan section) Acute Rehab PT Goals Patient Stated Goal: Less pain; 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, works part time as Producer, television/film/video PT Goal Formulation: With patient Time For Goal Achievement: 03/14/18 Potential to Achieve Goals: Fair Progress towards PT goals: Progressing toward goals    Frequency    Min 6X/week      PT Plan Current plan remains appropriate    Co-evaluation              AM-PAC PT "6 Clicks" Mobility   Outcome Measure  Help needed turning from your back to your side while in a flat bed without using bedrails?: A Little Help needed moving from  lying on your back to sitting on the side of a flat bed without using bedrails?: A Little Help needed moving to and from a bed to a chair (including a wheelchair)?: A Little Help needed standing up from a chair using your arms (e.g., wheelchair or bedside chair)?: A Little Help needed to walk in hospital room?: A Little Help needed climbing 3-5 steps with a railing? : A Lot 6 Click Score: 17    End of Session Equipment Utilized During Treatment: Gait belt Activity Tolerance: Patient limited by pain;Patient limited by fatigue;Other (comment)(anxiety level) Patient left: with call bell/phone within reach;in bed Nurse Communication: Mobility status PT Visit Diagnosis: Difficulty in walking, not elsewhere classified (R26.2);Pain Pain - Right/Left: Right Pain - part of body: Knee     Time: 7858-8502 PT Time Calculation (min) (ACUTE ONLY): 36 min  Charges:  $Gait Training: 8-22 mins $Therapeutic Activity: 8-22 mins                     Ralene Bathe Kistler PT 03/02/2018  Acute Rehabilitation Services Pager [phone] Office [phone]

## 2018-03-02 NOTE — Progress Notes (Signed)
Physical Therapy Treatment Patient Details Name: Michele Weiss MRN: [id number] DOB: 01/09/1986 Today's Date: 03/02/2018    History of Present Illness Pt s/p ORIF R patella fx    PT Comments    POD # 3 am session Pt in bed requesting to void.  Plans was to assist pt OOB to bathroom.  Assisted and supported R LE to attempted sliding OOB.  Instructed on use of belt to self assist R LE.  Pt emotional, crying and anxious about D/C today.  Pt asked about having 2 people help her.  Advised her I was there to assist but can call for a second person if needed.  Attempted to educate on self assist strategies using belt to move R LE off bed.  Pt required increased, increased time to initiate.  Pt became increasingly upset/emotinal that I encouraging self mobility.  Offered assist and moved R LE to side only to edge of bed when pt became more emotional and asked for another therapist.  Reported to RN and Supervising PT to have pt re assigned.    Follow Up Recommendations  Home health PT     Equipment Recommendations  Rolling walker with 5" wheels;3in1 (PT);Wheelchair (measurements PT);Wheelchair cushion (measurements PT)    Recommendations for Other Services OT consult     Precautions / Restrictions Precautions Precautions: Fall Required Braces or Orthoses: Other Brace Other Brace: Bledsoe brace R LE at all times Restrictions Weight Bearing Restrictions: No Other Position/Activity Restrictions: WBAT    Mobility    Balance   Cognition                                 General Comments: emotional, crying, mild anxiety about increased pain      Exercises      General Comments        Pertinent Vitals/Pain Pain Assessment: Faces Pain Score: 7  Faces Pain Scale: Hurts a little bit Pain Location: R knee with activity Pain Descriptors / Indicators: Grimacing Pain Intervention(s): Monitored during session;Repositioned;Premedicated before session    Home Living                       Prior Function            PT Goals (current goals can now be found in the care plan section) Acute Rehab PT Goals Patient Stated Goal: Less pain; 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, works part time as Producer, television/film/video PT Goal Formulation: With patient Time For Goal Achievement: 03/14/18 Potential to Achieve Goals: Fair Progress towards PT goals: Progressing toward goals    Frequency    Min 6X/week      PT Plan Current plan remains appropriate    Co-evaluation              AM-PAC PT "6 Clicks" Mobility   Outcome Measure  Help needed turning from your back to your side while in a flat bed without using bedrails?: A Little Help needed moving from lying on your back to sitting on the side of a flat bed without using bedrails?: A Little Help needed moving to and from a bed to a chair (including a wheelchair)?: A Little Help needed standing up from a chair using your arms (e.g., wheelchair or bedside chair)?: A Little Help needed to walk in hospital room?: A Little Help needed climbing 3-5 steps with a railing? : A Lot 6 Click Score: 17  End of Session Equipment Utilized During Treatment: Gait belt Activity Tolerance: Other (comment)(pt emotional, crying, requested 2 people to help her then requested another Therapist) Patient left: in bed Nurse Communication: Mobility status PT Visit Diagnosis: Difficulty in walking, not elsewhere classified (R26.2);Pain Pain - Right/Left: Right Pain - part of body: Knee     Time: [id number] PT Time Calculation (min) (ACUTE ONLY): 15 min  Charges:  $Therapeutic Activity: 8-22 mins                     Michele ShellingLori Steffani Weiss  PTA Acute  Rehabilitation Services Pager      [phone] Office      [phone]

## 2018-03-02 NOTE — Progress Notes (Signed)
Patient ID: Michele Weiss, female   DOB: March 22, 1985, 33 y.o.   MRN: [id number] No acute changes.  Right knee stable.  Can be discharged to home today.
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
## 2018-03-02 NOTE — Progress Notes (Signed)
Physical Therapy Treatment Patient Details Name: Michele Weiss MRN: [id number] DOB: Feb 19, 1985 Today's Date: 03/02/2018    History of Present Illness Pt s/p ORIF R patella fx    PT Comments    Pt completed stair training and is ready to DC home from PT standpoint.  Pt continues to require increased time and encouragement for all activity. She continues to keep RLE NWB. Encouraged pt to attempt gradually increasing weight bearing on RLE.    Follow Up Recommendations  Home health PT     Equipment Recommendations  Rolling walker with 5" wheels;3in1 (PT);Wheelchair (measurements PT);Wheelchair cushion (measurements PT)    Recommendations for Other Services OT consult     Precautions / Restrictions Precautions Precautions: Fall Required Braces or Orthoses: Other Brace Other Brace: Bledsoe brace R LE at all times Restrictions Weight Bearing Restrictions: No Other Position/Activity Restrictions: WBAT    Mobility  Bed Mobility Overal bed mobility: Needs Assistance Bed Mobility: Supine to Sit;Sit to Supine     Supine to sit: Modified independent (Device/Increase time) Sit to supine: Min guard   General bed mobility comments: pt self assisted RLE off bed using gait belt as a leg lifter, self assisted RLE into bed using LLE with min/guard for LLE  Transfers Overall transfer level: Needs assistance Equipment used: Rolling walker (2 wheeled) Transfers: Sit to/from Stand Sit to Stand: Supervision         General transfer comment: VCs hand placement, increased time, min A to lower RLE to floor (pt was sitting on BSC with RLE elevated on bed)  Ambulation/Gait Ambulation/Gait assistance: Min guard Gait Distance (Feet): 12 Feet Assistive device: Rolling walker (2 wheeled) Gait Pattern/deviations: Step-to pattern Gait velocity: decreased   General Gait Details: VCs sequencing, pt mostly holds RLE in NWB position, encouraged pt to attempt putting a little weight through  RLE, increased time for all mobility but progressing   Stairs Stairs: Yes Stairs assistance: Min guard Stair Management: One rail Left;With crutches;Forwards;Step to pattern Number of Stairs: 3 General stair comments: VCs sequencing, min/guard for safety   Wheelchair Mobility    Modified Rankin (Stroke Patients Only)       Balance Overall balance assessment: Needs assistance Sitting-balance support: No upper extremity supported;Feet supported Sitting balance-Leahy Scale: Good     Standing balance support: Bilateral upper extremity supported Standing balance-Leahy Scale: Poor Standing balance comment: Pt very reliant on RW for support                            Cognition Arousal/Alertness: Awake/alert Behavior During Therapy: WFL for tasks assessed/performed;Anxious Overall Cognitive Status: Within Functional Limits for tasks assessed                                 General Comments: anxiety present but improved from yesterday      Exercises General Exercises - Lower Extremity Ankle Circles/Pumps: AROM;Both;10 reps;Supine;AAROM    General Comments        Pertinent Vitals/Pain Pain Assessment: Faces Pain Score: 7  Faces Pain Scale: Hurts a little bit Pain Location: R knee with activity Pain Descriptors / Indicators: Sore Pain Intervention(s): Limited activity within patient's tolerance;Monitored during session;Premedicated before session;Repositioned;Ice applied;Relaxation    Home Living                      Prior Function  PT Goals (current goals can now be found in the care plan section) Acute Rehab PT Goals Patient Stated Goal: Less pain; 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, works part time as Producer, television/film/video PT Goal Formulation: With patient Time For Goal Achievement: 03/14/18 Potential to Achieve Goals: Fair Progress towards PT goals: Progressing toward goals    Frequency    Min 6X/week      PT Plan Current plan  remains appropriate    Co-evaluation              AM-PAC PT "6 Clicks" Mobility   Outcome Measure  Help needed turning from your back to your side while in a flat bed without using bedrails?: A Little Help needed moving from lying on your back to sitting on the side of a flat bed without using bedrails?: A Little Help needed moving to and from a bed to a chair (including a wheelchair)?: A Little Help needed standing up from a chair using your arms (e.g., wheelchair or bedside chair)?: A Little Help needed to walk in hospital room?: A Little Help needed climbing 3-5 steps with a railing? : A Lot 6 Click Score: 17    End of Session Equipment Utilized During Treatment: Gait belt Activity Tolerance: Patient limited by pain Patient left: in bed Nurse Communication: Mobility status PT Visit Diagnosis: Difficulty in walking, not elsewhere classified (R26.2);Pain Pain - Right/Left: Right Pain - part of body: Knee     Time: 2297-9892 PT Time Calculation (min) (ACUTE ONLY): 48 min  Charges:  $Gait Training: 23-37 mins $Therapeutic Activity: 8-22 mins                     Ralene Bathe Kistler PT 03/02/2018  Acute Rehabilitation Services Pager [phone] Office [phone]

## 2018-03-02 NOTE — Discharge Instructions (Signed)
You may put all of your weight on your right leg as comfort allows. Do not bend you right knee. You can get your dressing wet in the shower. You will need to be out of work the next 8-12 weeks.

## 2018-03-04 ENCOUNTER — Telehealth (INDEPENDENT_AMBULATORY_CARE_PROVIDER_SITE_OTHER): Payer: Self-pay | Admitting: Orthopaedic Surgery

## 2018-03-04 NOTE — Telephone Encounter (Signed)
Verbal order given  

## 2018-03-04 NOTE — Telephone Encounter (Signed)
[facility]  Suella Broad  [phone]    Verbal orders   2 times a week for 2 weeks   1 time a week for one week   Ambulation,strengthing and balance

## 2018-03-12 ENCOUNTER — Encounter (INDEPENDENT_AMBULATORY_CARE_PROVIDER_SITE_OTHER): Payer: Self-pay | Admitting: Orthopaedic Surgery

## 2018-03-12 ENCOUNTER — Ambulatory Visit (INDEPENDENT_AMBULATORY_CARE_PROVIDER_SITE_OTHER): Payer: Self-pay

## 2018-03-12 ENCOUNTER — Ambulatory Visit (INDEPENDENT_AMBULATORY_CARE_PROVIDER_SITE_OTHER): Payer: Self-pay | Admitting: Orthopaedic Surgery

## 2018-03-12 DIAGNOSIS — Z8781 Personal history of (healed) traumatic fracture: Secondary | ICD-10-CM

## 2018-03-12 DIAGNOSIS — Z9889 Other specified postprocedural states: Secondary | ICD-10-CM

## 2018-03-12 MED ORDER — OXYCODONE HCL 5 MG PO TABS: 5.0000 mg | ORAL_TABLET | ORAL | 0 refills | Status: DC | PRN
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
## 2018-03-18 ENCOUNTER — Encounter (HOSPITAL_COMMUNITY): Payer: Self-pay | Admitting: Orthopaedic Surgery

## 2018-03-23 ENCOUNTER — Telehealth (INDEPENDENT_AMBULATORY_CARE_PROVIDER_SITE_OTHER): Payer: Self-pay | Admitting: Orthopaedic Surgery

## 2018-03-23 NOTE — Telephone Encounter (Signed)
Verbal order given to [facility]

## 2018-03-23 NOTE — Telephone Encounter (Signed)
Amber/[facility]/PT needing verbal orders  1x week for 2 weeks  [phone]

## 2018-03-26 ENCOUNTER — Ambulatory Visit (INDEPENDENT_AMBULATORY_CARE_PROVIDER_SITE_OTHER): Payer: Self-pay | Admitting: Orthopaedic Surgery

## 2018-03-26 ENCOUNTER — Ambulatory Visit (INDEPENDENT_AMBULATORY_CARE_PROVIDER_SITE_OTHER): Payer: Self-pay

## 2018-03-26 ENCOUNTER — Encounter (HOSPITAL_COMMUNITY): Payer: Self-pay | Admitting: Orthopaedic Surgery

## 2018-03-26 DIAGNOSIS — Z8781 Personal history of (healed) traumatic fracture: Secondary | ICD-10-CM

## 2018-03-26 DIAGNOSIS — Z9889 Other specified postprocedural states: Secondary | ICD-10-CM

## 2018-03-26 MED ORDER — OXYCODONE HCL 5 MG PO TABS: 5.0000 mg | ORAL_TABLET | Freq: Four times a day (QID) | ORAL | 0 refills | Status: DC | PRN
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
## 2018-03-27 ENCOUNTER — Encounter (HOSPITAL_COMMUNITY): Payer: Self-pay | Admitting: Orthopaedic Surgery

## 2018-04-02 ENCOUNTER — Telehealth (INDEPENDENT_AMBULATORY_CARE_PROVIDER_SITE_OTHER): Payer: Self-pay | Admitting: Orthopaedic Surgery

## 2018-04-02 NOTE — Telephone Encounter (Signed)
I am fine with that referral.
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
## 2018-04-02 NOTE — Telephone Encounter (Signed)
Advise

## 2018-04-03 ENCOUNTER — Other Ambulatory Visit (INDEPENDENT_AMBULATORY_CARE_PROVIDER_SITE_OTHER): Payer: Self-pay

## 2018-04-03 DIAGNOSIS — S82041A Displaced comminuted fracture of right patella, initial encounter for closed fracture: Secondary | ICD-10-CM

## 2018-04-03 NOTE — Telephone Encounter (Signed)
Order sent.

## 2018-04-09 ENCOUNTER — Ambulatory Visit (INDEPENDENT_AMBULATORY_CARE_PROVIDER_SITE_OTHER): Payer: Self-pay

## 2018-04-09 ENCOUNTER — Ambulatory Visit (INDEPENDENT_AMBULATORY_CARE_PROVIDER_SITE_OTHER): Payer: Self-pay | Admitting: Orthopaedic Surgery

## 2018-04-09 ENCOUNTER — Encounter (INDEPENDENT_AMBULATORY_CARE_PROVIDER_SITE_OTHER): Payer: Self-pay | Admitting: Orthopaedic Surgery

## 2018-04-09 DIAGNOSIS — S82041A Displaced comminuted fracture of right patella, initial encounter for closed fracture: Secondary | ICD-10-CM
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
## 2018-04-30 ENCOUNTER — Other Ambulatory Visit: Payer: Self-pay

## 2018-04-30 ENCOUNTER — Encounter (INDEPENDENT_AMBULATORY_CARE_PROVIDER_SITE_OTHER): Payer: Self-pay | Admitting: Orthopaedic Surgery

## 2018-04-30 ENCOUNTER — Ambulatory Visit (INDEPENDENT_AMBULATORY_CARE_PROVIDER_SITE_OTHER): Payer: Self-pay | Admitting: Orthopaedic Surgery

## 2018-04-30 ENCOUNTER — Ambulatory Visit (INDEPENDENT_AMBULATORY_CARE_PROVIDER_SITE_OTHER): Payer: Self-pay

## 2018-04-30 DIAGNOSIS — S82041D Displaced comminuted fracture of right patella, subsequent encounter for closed fracture with routine healing: Secondary | ICD-10-CM

## 2018-04-30 NOTE — Progress Notes (Signed)
The patient is now 8 weeks status post a reduction in her fixation of a right comminuted patella fracture.  She is been in a hinged knee brace.  She has been reluctant to bend her knee at all.  Even with me opening up the brace from 0 to 60 degrees she has not been wanting to bend her knee.  She finally has a physical therapy visit and has been there.  Unfortunately due to the coronavirus pandemic they will have to do some virtual therapy sessions.  She has a whole list of home exercises to try.  On exam her incisions healed nicely and completely.  Her swelling is minimal.  The patella is in a good position.  Her range of motion is full extension but she will not flex to barely past 40 degrees.  New x-rays of her right knee and 2 views show the fracture is healing nicely to almost healed completely.  The hardware is intact.  I encouraged her to stay out of the Bledsoe brace at this point.  We will try just a small brace for comfort and support but she needs to bend her knee and I showed her some things to try to do this and get her knee moving.  We will see her back in 4 weeks for assessment of her range of motion.  I would like a single lateral view of the right knee at that visit.

## 2018-05-28 ENCOUNTER — Other Ambulatory Visit (INDEPENDENT_AMBULATORY_CARE_PROVIDER_SITE_OTHER): Payer: Self-pay | Admitting: Physician Assistant

## 2018-05-28 ENCOUNTER — Other Ambulatory Visit: Payer: Self-pay

## 2018-05-28 ENCOUNTER — Encounter (HOSPITAL_BASED_OUTPATIENT_CLINIC_OR_DEPARTMENT_OTHER): Payer: Self-pay | Admitting: *Deleted

## 2018-05-28 ENCOUNTER — Encounter (INDEPENDENT_AMBULATORY_CARE_PROVIDER_SITE_OTHER): Payer: Self-pay | Admitting: Orthopaedic Surgery

## 2018-05-28 ENCOUNTER — Ambulatory Visit (INDEPENDENT_AMBULATORY_CARE_PROVIDER_SITE_OTHER): Payer: Self-pay | Admitting: Orthopaedic Surgery

## 2018-05-28 ENCOUNTER — Ambulatory Visit (INDEPENDENT_AMBULATORY_CARE_PROVIDER_SITE_OTHER): Payer: Self-pay

## 2018-05-28 DIAGNOSIS — M24661 Ankylosis, right knee: Secondary | ICD-10-CM

## 2018-05-28 DIAGNOSIS — S82041D Displaced comminuted fracture of right patella, subsequent encounter for closed fracture with routine healing: Secondary | ICD-10-CM | POA: Insufficient documentation

## 2018-05-28 NOTE — Anesthesia Preprocedure Evaluation (Addendum)
Anesthesia Evaluation  Patient identified by MRN, date of birth, ID band Patient awake    Reviewed: Allergy & Precautions, NPO status , Patient's Chart, lab work & pertinent test results  Airway Mallampati: I  TM Distance: >3 FB Neck ROM: Full    Dental no notable dental hx. (+) Teeth Intact, Dental Advisory Given   Pulmonary neg pulmonary ROS,    Pulmonary exam normal breath sounds clear to auscultation       Cardiovascular negative cardio ROS Normal cardiovascular exam Rhythm:Regular Rate:Normal     Neuro/Psych negative neurological ROS  negative psych ROS   GI/Hepatic negative GI ROS, Neg liver ROS,   Endo/Other  negative endocrine ROS  Renal/GU negative Renal ROS  negative genitourinary   Musculoskeletal negative musculoskeletal ROS (+)   Abdominal   Peds  Hematology negative hematology ROS (+)   Anesthesia Other Findings   Reproductive/Obstetrics negative OB ROS                            Anesthesia Physical Anesthesia Plan  ASA: I  Anesthesia Plan: General   Post-op Pain Management:    Induction: Intravenous  PONV Risk Score and Plan: 3 and Midazolam, Ondansetron and Dexamethasone  Airway Management Planned: LMA  Additional Equipment:   Intra-op Plan:   Post-operative Plan: Extubation in OR  Informed Consent: I have reviewed the patients History and Physical, chart, labs and discussed the procedure including the risks, benefits and alternatives for the proposed anesthesia with the patient or authorized representative who has indicated his/her understanding and acceptance.     Dental advisory given  Plan Discussed with: CRNA  Anesthesia Plan Comments:         Anesthesia Quick Evaluation
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
## 2018-05-29 ENCOUNTER — Encounter (HOSPITAL_BASED_OUTPATIENT_CLINIC_OR_DEPARTMENT_OTHER)
Admission: RE | Disposition: A | Discharge status: Home / Self Care | Payer: Self-pay | Source: Home / Self Care | Attending: Orthopaedic Surgery

## 2018-05-29 ENCOUNTER — Ambulatory Visit (HOSPITAL_BASED_OUTPATIENT_CLINIC_OR_DEPARTMENT_OTHER): Payer: Self-pay | Admitting: Anesthesiology

## 2018-05-29 ENCOUNTER — Ambulatory Visit (HOSPITAL_BASED_OUTPATIENT_CLINIC_OR_DEPARTMENT_OTHER)
Admission: RE | Admit: 2018-05-29 | Discharge: 2018-05-29 | Disposition: A | Discharge status: Home / Self Care | Payer: Self-pay | Attending: Orthopaedic Surgery | Admitting: Orthopaedic Surgery

## 2018-05-29 ENCOUNTER — Encounter (HOSPITAL_BASED_OUTPATIENT_CLINIC_OR_DEPARTMENT_OTHER): Payer: Self-pay

## 2018-05-29 DIAGNOSIS — M24661 Ankylosis, right knee: Secondary | ICD-10-CM | POA: Diagnosis present

## 2018-05-29 HISTORY — PX: EXAM UNDER ANESTHESIA WITH MANIPULATION OF KNEE: SHX5816

## 2018-05-29 LAB — POCT PREGNANCY, URINE: Preg Test, Ur: NEGATIVE

## 2018-05-29 SURGERY — MANIPULATION, JOINT, KNEE, WITH ANESTHESIA
Anesthesia: General | Site: Knee | Laterality: Right

## 2018-05-29 MED ORDER — PROPOFOL 10 MG/ML IV BOLUS
INTRAVENOUS | Status: DC | PRN
  Administered 2018-05-29: 200 mg via INTRAVENOUS

## 2018-05-29 MED ORDER — LIDOCAINE 2% (20 MG/ML) 5 ML SYRINGE
INTRAMUSCULAR | Status: AC
  Filled 2018-05-29: qty 5

## 2018-05-29 MED ORDER — METHYLPREDNISOLONE ACETATE 40 MG/ML IJ SUSP
INTRAMUSCULAR | Status: DC | PRN
  Administered 2018-05-29: 1 mL via INTRAMUSCULAR

## 2018-05-29 MED ORDER — CHLORHEXIDINE GLUCONATE 4 % EX LIQD: 60.0000 mL | Freq: Once | CUTANEOUS | Status: DC

## 2018-05-29 MED ORDER — PROPOFOL 500 MG/50ML IV EMUL
INTRAVENOUS | Status: AC
  Filled 2018-05-29: qty 50

## 2018-05-29 MED ORDER — ACETAMINOPHEN 500 MG PO TABS
1000.0000 mg | ORAL_TABLET | Freq: Once | ORAL | Status: AC
  Administered 2018-05-29: 1000 mg via ORAL

## 2018-05-29 MED ORDER — EPHEDRINE 5 MG/ML INJ
INTRAVENOUS | Status: AC
  Filled 2018-05-29: qty 10

## 2018-05-29 MED ORDER — FENTANYL CITRATE (PF) 100 MCG/2ML IJ SOLN
INTRAMUSCULAR | Status: AC
  Filled 2018-05-29: qty 2

## 2018-05-29 MED ORDER — MIDAZOLAM HCL 2 MG/2ML IJ SOLN
1.0000 mg | INTRAMUSCULAR | Status: DC | PRN
  Administered 2018-05-29 (×2): 1 mg via INTRAVENOUS

## 2018-05-29 MED ORDER — KETOROLAC TROMETHAMINE 30 MG/ML IJ SOLN
30.0000 mg | Freq: Once | INTRAMUSCULAR | Status: AC
  Administered 2018-05-29: 09:00:00 30 mg via INTRAVENOUS

## 2018-05-29 MED ORDER — SUCCINYLCHOLINE CHLORIDE 200 MG/10ML IV SOSY
PREFILLED_SYRINGE | INTRAVENOUS | Status: AC
  Filled 2018-05-29: qty 10

## 2018-05-29 MED ORDER — PHENYLEPHRINE 40 MCG/ML (10ML) SYRINGE FOR IV PUSH (FOR BLOOD PRESSURE SUPPORT)
PREFILLED_SYRINGE | INTRAVENOUS | Status: AC
  Filled 2018-05-29: qty 10

## 2018-05-29 MED ORDER — FENTANYL CITRATE (PF) 100 MCG/2ML IJ SOLN
50.0000 ug | INTRAMUSCULAR | Status: DC | PRN
  Administered 2018-05-29 (×2): 50 ug via INTRAVENOUS

## 2018-05-29 MED ORDER — ONDANSETRON HCL 4 MG/2ML IJ SOLN
INTRAMUSCULAR | Status: DC | PRN
  Administered 2018-05-29: 4 mg via INTRAVENOUS

## 2018-05-29 MED ORDER — HYDROCODONE-ACETAMINOPHEN 5-325 MG PO TABS: 1.0000 | ORAL_TABLET | Freq: Four times a day (QID) | ORAL | 0 refills | Status: DC | PRN

## 2018-05-29 MED ORDER — MIDAZOLAM HCL 2 MG/2ML IJ SOLN
INTRAMUSCULAR | Status: AC
  Filled 2018-05-29: qty 2

## 2018-05-29 MED ORDER — SCOPOLAMINE 1 MG/3DAYS TD PT72: 1.0000 | MEDICATED_PATCH | Freq: Once | TRANSDERMAL | Status: DC | PRN

## 2018-05-29 MED ORDER — LACTATED RINGERS IV SOLN
INTRAVENOUS | Status: DC
  Administered 2018-05-29: 10 mL/h via INTRAVENOUS

## 2018-05-29 MED ORDER — KETOROLAC TROMETHAMINE 30 MG/ML IJ SOLN
INTRAMUSCULAR | Status: AC
  Filled 2018-05-29: qty 1

## 2018-05-29 MED ORDER — ONDANSETRON HCL 4 MG/2ML IJ SOLN
INTRAMUSCULAR | Status: AC
  Filled 2018-05-29: qty 2

## 2018-05-29 MED ORDER — ACETAMINOPHEN 500 MG PO TABS
ORAL_TABLET | ORAL | Status: AC
  Filled 2018-05-29: qty 2

## 2018-05-29 MED ORDER — FENTANYL CITRATE (PF) 100 MCG/2ML IJ SOLN
25.0000 ug | INTRAMUSCULAR | Status: DC | PRN
  Administered 2018-05-29 (×2): 25 ug via INTRAVENOUS
  Administered 2018-05-29 (×2): 50 ug via INTRAVENOUS

## 2018-05-29 SURGICAL SUPPLY — 16 items
BANDAGE ADH SHEER 1  50/CT (GAUZE/BANDAGES/DRESSINGS) ×4 IMPLANT
GAUZE SPONGE 4X4 12PLY STRL LF (GAUZE/BANDAGES/DRESSINGS) ×2 IMPLANT
GLOVE BIOGEL PI IND STRL 7.0 (GLOVE) IMPLANT
GLOVE BIOGEL PI INDICATOR 7.0 (GLOVE)
GLOVE SKINSENSE NS SZ7.5 (GLOVE)
GLOVE SKINSENSE STRL SZ7.5 (GLOVE) IMPLANT
GOWN STRL REIN XL XLG (GOWN DISPOSABLE) ×1 IMPLANT
GOWN STRL REUS W/ TWL XL LVL3 (GOWN DISPOSABLE) ×1 IMPLANT
GOWN STRL REUS W/TWL XL LVL3 (GOWN DISPOSABLE)
NDL SAFETY ECLIPSE 18X1.5 (NEEDLE) ×1 IMPLANT
NEEDLE HYPO 18GX1.5 SHARP (NEEDLE) ×3
NEEDLE HYPO 22GX1.5 SAFETY (NEEDLE) ×3 IMPLANT
PAD ALCOHOL SWAB (MISCELLANEOUS) ×15 IMPLANT
SYR 20CC LL (SYRINGE) ×1 IMPLANT
SYR CONTROL 10ML LL (SYRINGE) ×3 IMPLANT
TOWEL GREEN STERILE FF (TOWEL DISPOSABLE) ×1 IMPLANT

## 2018-05-29 NOTE — Brief Op Note (Signed)
05/29/2018  7:56 AM  PATIENT:  Michele Weiss  33 y.o. female  PRE-OPERATIVE DIAGNOSIS:  Right Knee Arthrofibrosis  POST-OPERATIVE DIAGNOSIS:  Right Knee Arthrofibrosis  PROCEDURE:  Procedure(s): RIGHT KNEE MANIPULATION UNDER ANESTHESIA (Right)  SURGEON:  Surgeon(s) and Role:    Kathryne Hitch, MD - Primary  ANESTHESIA:   local and general  EBL:  0 mL   COUNTS:  YES  TOURNIQUET:  * No tourniquets in log *  DICTATION: .Other Dictation: Dictation Number [id number]  PLAN OF CARE: Discharge to home after PACU  PATIENT DISPOSITION:  PACU - hemodynamically stable.   Delay start of Pharmacological VTE agent (>24hrs) due to surgical blood loss or risk of bleeding: no

## 2018-05-29 NOTE — Transfer of Care (Signed)
Immediate Anesthesia Transfer of Care Note  Patient: Michele Weiss  Procedure(s) Performed: RIGHT KNEE MANIPULATION UNDER ANESTHESIA (Right Knee)  Patient Location: PACU  Anesthesia Type:General  Level of Consciousness: sedated  Airway & Oxygen Therapy: Patient Spontanous Breathing and Patient connected to face mask oxygen  Post-op Assessment: Report given to RN and Post -op Vital signs reviewed and stable  Post vital signs: Reviewed and stable  Last Vitals:  Vitals Value Taken Time  BP 121/77 05/29/2018  8:00 AM  Temp    Pulse 85 05/29/2018  8:00 AM  Resp 14 05/29/2018  8:00 AM  SpO2 100 % 05/29/2018  8:00 AM  Vitals shown include unvalidated device data.  Last Pain:  Vitals:   05/29/18 0646  TempSrc: Oral  PainSc: 0-No pain      Patients Stated Pain Goal: 2 (05/29/18 7026)  Complications: No apparent anesthesia complications
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
## 2018-05-29 NOTE — Anesthesia Procedure Notes (Signed)
Procedure Name: LMA Insertion Date/Time: 05/29/2018 7:47 AM Performed by: Ronnette Hila, CRNA Pre-anesthesia Checklist: Patient identified, Emergency Drugs available, Suction available and Patient being monitored Patient Re-evaluated:Patient Re-evaluated prior to induction Oxygen Delivery Method: Circle system utilized Preoxygenation: Pre-oxygenation with 100% oxygen Induction Type: IV induction Ventilation: Mask ventilation without difficulty LMA: LMA inserted LMA Size: 3.0 Number of attempts: 1 Airway Equipment and Method: Bite block Placement Confirmation: positive ETCO2 Tube secured with: Tape Dental Injury: Teeth and Oropharynx as per pre-operative assessment

## 2018-05-29 NOTE — Discharge Instructions (Signed)
Get your knee bending back and forth as much as possible. Work on flexing your knee through out the day.  Do not take any nonsteroidal anti inflammatories until after 2:30 pm today. Do not take any tylenol until after 12:45 pm today.    Post Anesthesia Home Care Instructions  Activity: Get plenty of rest for the remainder of the day. A responsible individual must stay with you for 24 hours following the procedure.  For the next 24 hours, DO NOT: -Drive a car -Advertising copywriter -Drink alcoholic beverages -Take any medication unless instructed by your physician -Make any legal decisions or sign important papers.  Meals: Start with liquid foods such as gelatin or soup. Progress to regular foods as tolerated. Avoid greasy, spicy, heavy foods. If nausea and/or vomiting occur, drink only clear liquids until the nausea and/or vomiting subsides. Call your physician if vomiting continues.  Special Instructions/Symptoms: Your throat may feel dry or sore from the anesthesia or the breathing tube placed in your throat during surgery. If this causes discomfort, gargle with warm salt water. The discomfort should disappear within 24 hours.

## 2018-05-29 NOTE — H&P (Signed)
Michele Weiss is an 33 y.o. female.   Chief Complaint:  Unable to bend right knee HPI:   This is a 33 yo female who is over 3 months post fixation of a right complex patella fracture.  She has since healed the fracture, but has been unable to get her knee bending secondary to pain and has now developed arthrofibrosis and the inability to flex her right knee past 50 degrees due to the development of scar tissue.  We have recommended a manipulation of her right knee under anesthesia to hopefully free up scar tissue and improve her knee motion.  Past Medical History:  Diagnosis Date  . Medical history non-contributory   . Pneumonia    hx of as a child     Past Surgical History:  Procedure Laterality Date  . OOPHORECTOMY Right 2017  . ORIF PATELLA Right 02/27/2018   Procedure: OPEN REDUCTION INTERNAL (ORIF) FIXATION RIGHT PATELLA;  Surgeon: Kathryne Hitch, MD;  Location: [facility];  Service: Orthopedics;  Laterality: Right;    History reviewed. No pertinent family history. Social History:  reports that she has never smoked. She has never used smokeless tobacco. She reports current alcohol use. She reports that she does not use drugs.  Allergies: No Known Allergies  No medications prior to admission.    No results found for this or any previous visit (from the past 48 hour(s)). No results found.  Review of Systems  All other systems reviewed and are negative.   Blood pressure 116/89, pulse 78, temperature 98.1 F (36.7 C), temperature source Oral, resp. rate 16, height 5\' 2"  (1.575 m), weight 63.7 kg, last menstrual period 05/19/2018, SpO2 100 %, unknown if currently breastfeeding. Physical Exam  Constitutional: She is oriented to person, place, and time. She appears well-developed and well-nourished.  HENT:  Head: Normocephalic and atraumatic.  Eyes: Pupils are equal, round, and reactive to light. EOM are normal.  Neck: Normal range of motion. Neck supple.  Cardiovascular:  Normal rate and regular rhythm.  Respiratory: Effort normal and breath sounds normal.  GI: Soft. Bowel sounds are normal.  Musculoskeletal:     Right knee: She exhibits decreased range of motion.  Neurological: She is alert and oriented to person, place, and time.  Skin: Skin is warm and dry.  Psychiatric: She has a normal mood and affect.     Assessment/Plan Right knee arthrofibrosis post ORIF of a right patella fracture  To the OR today as an outpatient for a manipulation under anesthesia of her right knee.  The risks and benefits of surgery have been discussed in detail and informed consent is obtained.  Kathryne Hitch, MD 05/29/2018, 7:29 AM

## 2018-05-29 NOTE — Anesthesia Postprocedure Evaluation (Signed)
Anesthesia Post Note  Patient: GERTHA HUTMACHER  Procedure(s) Performed: RIGHT KNEE MANIPULATION UNDER ANESTHESIA (Right Knee)     Patient location during evaluation: PACU Anesthesia Type: General Level of consciousness: awake and alert Pain management: pain level controlled Vital Signs Assessment: post-procedure vital signs reviewed and stable Respiratory status: spontaneous breathing, nonlabored ventilation, respiratory function stable and patient connected to nasal cannula oxygen Cardiovascular status: blood pressure returned to baseline and stable Postop Assessment: no apparent nausea or vomiting Anesthetic complications: no    Last Vitals:  Vitals:   05/29/18 0824 05/29/18 0830  BP: (!) 125/99 (!) 133/98  Pulse: 80 77  Resp: 14 17  Temp:    SpO2: 100% 100%    Last Pain:  Vitals:   05/29/18 0815  TempSrc:   PainSc: 5                  Elisha Mcgruder L Maryalyce Sanjuan

## 2018-06-01 ENCOUNTER — Encounter (HOSPITAL_BASED_OUTPATIENT_CLINIC_OR_DEPARTMENT_OTHER): Payer: Self-pay | Admitting: Orthopaedic Surgery

## 2018-06-02 ENCOUNTER — Encounter (INDEPENDENT_AMBULATORY_CARE_PROVIDER_SITE_OTHER): Payer: Self-pay | Admitting: Orthopaedic Surgery

## 2018-06-02 ENCOUNTER — Ambulatory Visit (INDEPENDENT_AMBULATORY_CARE_PROVIDER_SITE_OTHER): Payer: Self-pay | Admitting: Orthopaedic Surgery

## 2018-06-02 ENCOUNTER — Other Ambulatory Visit: Payer: Self-pay

## 2018-06-02 DIAGNOSIS — S82041D Displaced comminuted fracture of right patella, subsequent encounter for closed fracture with routine healing: Secondary | ICD-10-CM

## 2018-06-02 DIAGNOSIS — M24661 Ankylosis, right knee: Secondary | ICD-10-CM

## 2018-06-02 NOTE — Progress Notes (Signed)
The patient is here today for a status post manipulation under anesthesia of her right knee due to arthrofibrosis.  She developed this after fixation of a comminuted complex patella fracture.  She said that her knee is still weak and she has been trying to get bending.  I was able to show her a picture that I took in the operating room of her knee having it bit back to past 120 degrees.  On exam today she can extend her knee but she is weak doing so.  She is hesitant to flex past 90 degrees which is still improved from her last visit preoperative.  I stressed the importance of getting her knee bending and moving.  She is someone who does not have health insurance and cannot afford to go to physical therapy.  She has been to a pro bono clinic which is just showed her some things via video but no one has been able to put hands on her leg from a therapy standpoint to help with her bending the knee.  I would like to see her back in 2 weeks to see how she is doing overall.  I would like an AP and lateral of her right knee at that visit.  This can be done supine.

## 2018-06-03 ENCOUNTER — Telehealth (INDEPENDENT_AMBULATORY_CARE_PROVIDER_SITE_OTHER): Payer: Self-pay | Admitting: Orthopaedic Surgery

## 2018-06-03 MED ORDER — HYDROCODONE-ACETAMINOPHEN 5-325 MG PO TABS: 1.0000 | ORAL_TABLET | Freq: Four times a day (QID) | ORAL | 0 refills | Status: AC | PRN

## 2018-06-03 NOTE — Telephone Encounter (Signed)
Please advise 

## 2018-06-03 NOTE — Telephone Encounter (Signed)
Patient requesting pain meds. Had knee manipulation last week and is now out.  She was seen yesterday, but forgot to ask.  Next follow up is in 2 weeks. Please call patient if Rx is called in.    [facility]

## 2018-06-04 ENCOUNTER — Ambulatory Visit (INDEPENDENT_AMBULATORY_CARE_PROVIDER_SITE_OTHER): Payer: Self-pay | Admitting: Orthopaedic Surgery

## 2018-06-05 ENCOUNTER — Telehealth (INDEPENDENT_AMBULATORY_CARE_PROVIDER_SITE_OTHER): Payer: Self-pay | Admitting: Orthopaedic Surgery

## 2018-06-05 NOTE — Telephone Encounter (Signed)
Patient calling in reference to outpatient physical therapy.  She states she does not have insurance and would like to know how much therapy will cost and for how long.  Also is there any place within [facility] that she can be referred to that would provide assistance.  Please advise

## 2018-06-05 NOTE — Telephone Encounter (Signed)
I really don't know the answer to any of that? Cassandra may know something about [facility] assistance?

## 2018-06-16 ENCOUNTER — Ambulatory Visit: Payer: Self-pay | Admitting: Orthopaedic Surgery

## 2019-12-29 ENCOUNTER — Encounter (HOSPITAL_COMMUNITY): Payer: Self-pay

## 2019-12-29 ENCOUNTER — Ambulatory Visit (HOSPITAL_COMMUNITY)
Admission: EM | Admit: 2019-12-29 | Discharge: 2019-12-29 | Disposition: A | Discharge status: Home / Self Care | Payer: Self-pay | Attending: Family Medicine | Admitting: Family Medicine

## 2019-12-29 ENCOUNTER — Other Ambulatory Visit: Payer: Self-pay

## 2019-12-29 DIAGNOSIS — N3090 Cystitis, unspecified without hematuria: Secondary | ICD-10-CM

## 2019-12-29 DIAGNOSIS — Z113 Encounter for screening for infections with a predominantly sexual mode of transmission: Secondary | ICD-10-CM

## 2019-12-29 LAB — POCT URINALYSIS DIPSTICK, ED / UC
Bilirubin Urine: NEGATIVE
Glucose, UA: NEGATIVE mg/dL
Ketones, ur: NEGATIVE mg/dL
Nitrite: POSITIVE — AB
Protein, ur: 30 mg/dL — AB
Specific Gravity, Urine: 1.025 (ref 1.005–1.030)
Urobilinogen, UA: 1 mg/dL (ref 0.0–1.0)
pH: 6 (ref 5.0–8.0)

## 2019-12-29 LAB — HIV ANTIBODY (ROUTINE TESTING W REFLEX): HIV Screen 4th Generation wRfx: NONREACTIVE

## 2019-12-29 MED ORDER — CEPHALEXIN 500 MG PO CAPS: 500.0000 mg | ORAL_CAPSULE | Freq: Two times a day (BID) | ORAL | 0 refills | Status: AC

## 2019-12-29 NOTE — Discharge Instructions (Signed)
We have sent testing for sexually transmitted infections. We will notify you of any positive results once they are received. If required, we will prescribe any medications you might need.  Please refrain from all sexual activity for at least the next seven days.  

## 2019-12-29 NOTE — ED Provider Notes (Signed)
[facility]    ASSESSMENT & PLAN:  1. Cystitis   2. Screening for STDs (sexually transmitted diseases)     Begin: Meds ordered this encounter  Medications  . cephALEXin (KEFLEX) 500 MG capsule    Sig: Take 1 capsule (500 mg total) by mouth 2 (two) times daily.    Dispense:  10 capsule    Refill:  0     Discharge Instructions     We have sent testing for sexually transmitted infections. We will notify you of any positive results once they are received. If required, we will prescribe any medications you might need.  Please refrain from all sexual activity for at least the next seven days.      No signs of pyelonephritis or PID. Discussed. Urine culture and vaginal cytology sent. Will notify patient of any significant results. Will follow up with her PCP or here if not showing improvement over the next 48 hours, sooner if needed.  Outlined signs and symptoms indicating need for more acute intervention. Patient verbalized understanding. After Visit Summary given.  SUBJECTIVE:  Michele Weiss is a 34 y.o. female who complains of urinary frequency, urgency and dysuria for the past few days. Without associated flank pain, fever, chills, vaginal discharge or bleeding. Gross hematuria: not present. No specific aggravating or alleviating factors reported. No LE edema. Normal PO intake without n/v/d. Without specific abdominal pain. Ambulatory without difficulty. Also requests STD testing; no vaginal discharge. OTC treatment: none. H/O UTI: none reported.Marland Kitchen  LMP: Patient's last menstrual period was 12/14/2019 (exact date).   OBJECTIVE:  Vitals:   12/29/19 1020  BP: 116/70  Pulse: 78  Resp: 16  Temp: 98.6 F (37 C)  TempSrc: Oral  SpO2: 99%   General appearance: alert; no distress Lungs: unlabored respirations Abdomen: no pain reported Back: no CVA tenderness reported GU: deferred Extremities: no edema; symmetrical with no gross deformities Skin: warm  and dry Neurologic: normal gait Psychological: alert and cooperative; normal mood and affect  Labs Reviewed  POCT URINALYSIS DIPSTICK, ED / UC - Abnormal; Notable for the following components:      Result Value   Hgb urine dipstick MODERATE (*)    Protein, ur 30 (*)    Nitrite POSITIVE (*)    Leukocytes,Ua SMALL (*)    All other components within normal limits  URINE CULTURE  RPR  HIV ANTIBODY (ROUTINE TESTING W REFLEX)  CERVICOVAGINAL ANCILLARY ONLY    No Known Allergies  Past Medical History:  Diagnosis Date  . Medical history non-contributory   . Pneumonia    hx of as a child    Social History   Socioeconomic History  . Marital status: Single    Spouse name: Not on file  . Number of children: Not on file  . Years of education: Not on file  . Highest education level: Not on file  Occupational History  . Not on file  Tobacco Use  . Smoking status: Never Smoker  . Smokeless tobacco: Never Used  Vaping Use  . Vaping Use: Never used  Substance and Sexual Activity  . Alcohol use: Yes  . Drug use: No  . Sexual activity: Yes    Birth control/protection: None  Other Topics Concern  . Not on file  Social History Narrative  . Not on file   Social Determinants of Health   Financial Resource Strain:   . Difficulty of Paying Living Expenses: Not on file  Food Insecurity:   . Worried  About Running Out of Food in the Last Year: Not on file  . Ran Out of Food in the Last Year: Not on file  Transportation Needs:   . Lack of Transportation (Medical): Not on file  . Lack of Transportation (Non-Medical): Not on file  Physical Activity:   . Days of Exercise per Week: Not on file  . Minutes of Exercise per Session: Not on file  Stress:   . Feeling of Stress : Not on file  Social Connections:   . Frequency of Communication with Friends and Family: Not on file  . Frequency of Social Gatherings with Friends and Family: Not on file  . Attends Religious Services: Not on  file  . Active Member of Clubs or Organizations: Not on file  . Attends Banker Meetings: Not on file  . Marital Status: Not on file  Intimate Partner Violence:   . Fear of Current or Ex-Partner: Not on file  . Emotionally Abused: Not on file  . Physically Abused: Not on file  . Sexually Abused: Not on file   History reviewed. No pertinent family history.     Mardella Layman, MD 12/29/19 1049

## 2019-12-29 NOTE — ED Triage Notes (Signed)
Pt presents with burning when urinating and increased urinating frequency x 5 days. Denies nausea, chills, back pain, vaginal discharge.   Pt requested STD's testing.

## 2019-12-30 LAB — RPR: RPR Ser Ql: NONREACTIVE

## 2019-12-31 ENCOUNTER — Telehealth (HOSPITAL_COMMUNITY): Payer: Self-pay | Admitting: Emergency Medicine

## 2019-12-31 LAB — URINE CULTURE: Culture: >=100000 — AB

## 2019-12-31 LAB — CERVICOVAGINAL ANCILLARY ONLY
Bacterial Vaginitis (gardnerella): POSITIVE — AB
Candida Glabrata: NEGATIVE
Candida Vaginitis: NEGATIVE
Chlamydia: NEGATIVE
Comment: NEGATIVE
Comment: NEGATIVE
Comment: NEGATIVE
Comment: NEGATIVE
Comment: NEGATIVE
Comment: NORMAL
Neisseria Gonorrhea: NEGATIVE
Trichomonas: NEGATIVE

## 2019-12-31 MED ORDER — METRONIDAZOLE 0.75 % VA GEL: 1.0000 | Freq: Every day | VAGINAL | 0 refills | Status: AC

## 2020-02-23 ENCOUNTER — Ambulatory Visit (HOSPITAL_COMMUNITY)
Admission: EM | Admit: 2020-02-23 | Discharge: 2020-02-23 | Disposition: A | Discharge status: Home / Self Care | Payer: Self-pay | Attending: Emergency Medicine | Admitting: Emergency Medicine

## 2020-02-23 ENCOUNTER — Ambulatory Visit (INDEPENDENT_AMBULATORY_CARE_PROVIDER_SITE_OTHER): Payer: Self-pay

## 2020-02-23 ENCOUNTER — Other Ambulatory Visit: Payer: Self-pay

## 2020-02-23 ENCOUNTER — Encounter (HOSPITAL_COMMUNITY): Payer: Self-pay

## 2020-02-23 DIAGNOSIS — M25531 Pain in right wrist: Secondary | ICD-10-CM

## 2020-02-23 MED ORDER — IBUPROFEN 600 MG PO TABS: 600.0000 mg | ORAL_TABLET | Freq: Four times a day (QID) | ORAL | 0 refills | Status: AC | PRN

## 2020-02-23 NOTE — ED Triage Notes (Signed)
Patient presents to Urgent Care with complaints of right wrist injury. Pt states she fell while roller skating on 1/13. Pt has been wrapping wrist with ace wrap, applying ice, keeping wrist elevated, and resting. Pt here for confirmation to see if sprained or broken.

## 2020-02-23 NOTE — ED Provider Notes (Signed)
[facility]    CSN: [id number] Arrival date & time: 02/23/20  1209      History   Chief Complaint Chief Complaint  Patient presents with  . Wrist Injury    HPI Michele Weiss is a 35 y.o. female.   Patient presents with pain and swelling of her right wrist after falling while rollerskating on 02/17/2020.  She denies numbness, weakness, paresthesias, open wounds, redness, bruising, or other symptoms.  Treatment attempted at home with Ace wrap, ice packs, rest, elevation.  The history is provided by the patient.    Past Medical History:  Diagnosis Date  . Medical history non-contributory   . Pneumonia    hx of as a child     Patient Active Problem List   Diagnosis Date Noted  . Arthrofibrosis of knee joint, right 05/28/2018  . Closed displaced comminuted fracture of right patella with routine healing 05/28/2018  . Right patella fracture 02/27/2018  . Displaced comminuted fracture of right patella, initial encounter for closed fracture 02/25/2018  . Normal labor 04/02/2016  . Supervision of normal pregnancy in third trimester 03/27/2016  . 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 EXAM UNDER ANESTHESIA WITH MANIPULATION OF KNEE Right 05/29/2018   Procedure: RIGHT KNEE MANIPULATION UNDER ANESTHESIA;  Surgeon: Kathryne Hitch, MD;  Location: [facility];  Service: Orthopedics;  Laterality: Right;  . OOPHORECTOMY Right 2017  . ORIF PATELLA Right 02/27/2018   Procedure: OPEN REDUCTION INTERNAL (ORIF) FIXATION RIGHT PATELLA;  Surgeon: Kathryne Hitch, MD;  Location: [facility];  Service: Orthopedics;  Laterality: Right;    OB History    Gravida  4   Para  3   Term  2   Preterm      AB  1   Living  3     SAB      IAB      Ectopic  1   Multiple  0   Live Births  3            Home Medications    Prior to Admission medications   Medication Sig Start Date End Date  Taking? Authorizing Provider  ibuprofen (ADVIL) 600 MG tablet Take 1 tablet (600 mg total) by mouth every 6 (six) hours as needed. 02/23/20  Yes Mickie Bail, NP  cephALEXin (KEFLEX) 500 MG capsule Take 1 capsule (500 mg total) by mouth 2 (two) times daily. 12/29/19   Mardella Layman, MD    Family History Family History  Problem Relation Age of Onset  . Healthy Mother   . Healthy Father     Social History Social History   Tobacco Use  . Smoking status: Never Smoker  . Smokeless tobacco: Never Used  Vaping Use  . Vaping Use: Never used  Substance Use Topics  . Alcohol use: Yes  . Drug use: No     Allergies   Patient has no known allergies.   Review of Systems Review of Systems  Constitutional: Negative for chills and fever.  HENT: Negative for ear pain and sore throat.   Eyes: Negative for pain and visual disturbance.  Respiratory: Negative for cough and shortness of breath.   Cardiovascular: Negative for chest pain and palpitations.  Gastrointestinal: Negative for abdominal pain and vomiting.  Genitourinary: Negative for dysuria and hematuria.  Musculoskeletal: Positive for arthralgias. Negative for back pain.  Skin: Negative for color change and rash.  Neurological: Negative for  syncope, weakness and numbness.  All other systems reviewed and are negative.    Physical Exam Triage Vital Signs ED Triage Vitals  Enc Vitals Group     BP      Pulse      Resp      Temp      Temp src      SpO2      Weight      Height      Head Circumference      Peak Flow      Pain Score      Pain Loc      Pain Edu?      Excl. in GC?    No data found.  Updated Vital Signs BP 128/88 (BP Location: Left Arm)   Pulse 69   Temp 98.2 F (36.8 C) (Oral)   Resp 16   Wt 145 lb (65.8 kg)   LMP 01/28/2020 Comment: last week in dec   SpO2 96%   BMI 26.52 kg/m   Visual Acuity Right Eye Distance:   Left Eye Distance:   Bilateral Distance:    Right Eye Near:   Left Eye  Near:    Bilateral Near:     Physical Exam Vitals and nursing note reviewed.  Constitutional:      General: She is not in acute distress.    Appearance: She is well-developed and well-nourished. She is not ill-appearing.  HENT:     Head: Normocephalic and atraumatic.     Mouth/Throat:     Mouth: Mucous membranes are moist.  Eyes:     Conjunctiva/sclera: Conjunctivae normal.  Cardiovascular:     Rate and Rhythm: Normal rate and regular rhythm.     Heart sounds: Normal heart sounds.  Pulmonary:     Effort: Pulmonary effort is normal. No respiratory distress.     Breath sounds: Normal breath sounds.  Abdominal:     Palpations: Abdomen is soft.     Tenderness: There is no abdominal tenderness.  Musculoskeletal:        General: Swelling and tenderness present. No deformity or edema.       Arms:     Cervical back: Neck supple.  Skin:    General: Skin is warm and dry.     Capillary Refill: Capillary refill takes less than 2 seconds.     Findings: No bruising, erythema, lesion or rash.  Neurological:     General: No focal deficit present.     Mental Status: She is alert and oriented to person, place, and time.     Sensory: No sensory deficit.     Motor: No weakness.     Gait: Gait normal.  Psychiatric:        Mood and Affect: Mood and affect and mood normal.        Behavior: Behavior normal.      UC Treatments / Results  Labs (all labs ordered are listed, but only abnormal results are displayed) Labs Reviewed - No data to display  EKG   Radiology DG Wrist Complete Right  Result Date: 02/23/2020 CLINICAL DATA:  Right wrist pain after fall while skating 6 days prior. EXAM: RIGHT WRIST - COMPLETE 3+ VIEW COMPARISON:  06/22/2004 right wrist radiographs FINDINGS: There is no evidence of fracture or dislocation. There is no evidence of arthropathy or other focal bone abnormality. Soft tissues are unremarkable. IMPRESSION: No right wrist fracture or dislocation.  Electronically Signed   By: Jannifer Rodney.D.  On: 02/23/2020 14:56    Procedures Procedures (including critical care time)  Medications Ordered in UC Medications - No data to display  Initial Impression / Assessment and Plan / UC Course  I have reviewed the triage vital signs and the nursing notes.  Pertinent labs & imaging results that were available during my care of the patient were reviewed by me and considered in my medical decision making (see chart for details).   Right wrist pain.  Xray negative.  Treating with ibuprofen, rest, elevation, ice packs, wrist splint.  Patient instructed to follow-up with orthopedics if her symptoms are not improving.  She agrees to plan of care.   Final Clinical Impressions(s) / UC Diagnoses   Final diagnoses:  Right wrist pain     Discharge Instructions     Take the ibuprofen as prescribed.  Rest and elevate your wrist.  Apply ice packs 2-3 times a day for up to 20 minutes each.  Wear the wrist splint as needed for comfort.    Follow up with an orthopedist if your symptoms are not improving.       ED Prescriptions    Medication Sig Dispense Auth. Provider   ibuprofen (ADVIL) 600 MG tablet Take 1 tablet (600 mg total) by mouth every 6 (six) hours as needed. 30 tablet Mickie Bail, NP     PDMP not reviewed this encounter.   Mickie Bail, NP 02/23/20 1505

## 2020-02-23 NOTE — Discharge Instructions (Signed)
Take the ibuprofen as prescribed.  Rest and elevate your wrist.  Apply ice packs 2-3 times a day for up to 20 minutes each.  Wear the wrist splint as needed for comfort.    Follow up with an orthopedist if your symptoms are not improving.

## 2020-08-16 IMAGING — CR DG CHEST 2V
2 series · 2 of 2 positions shown · non-contrast
Comparison: None.

CLINICAL DATA: Shortness of breath

EXAM:
CHEST - 2 VIEW

[x chest ap]
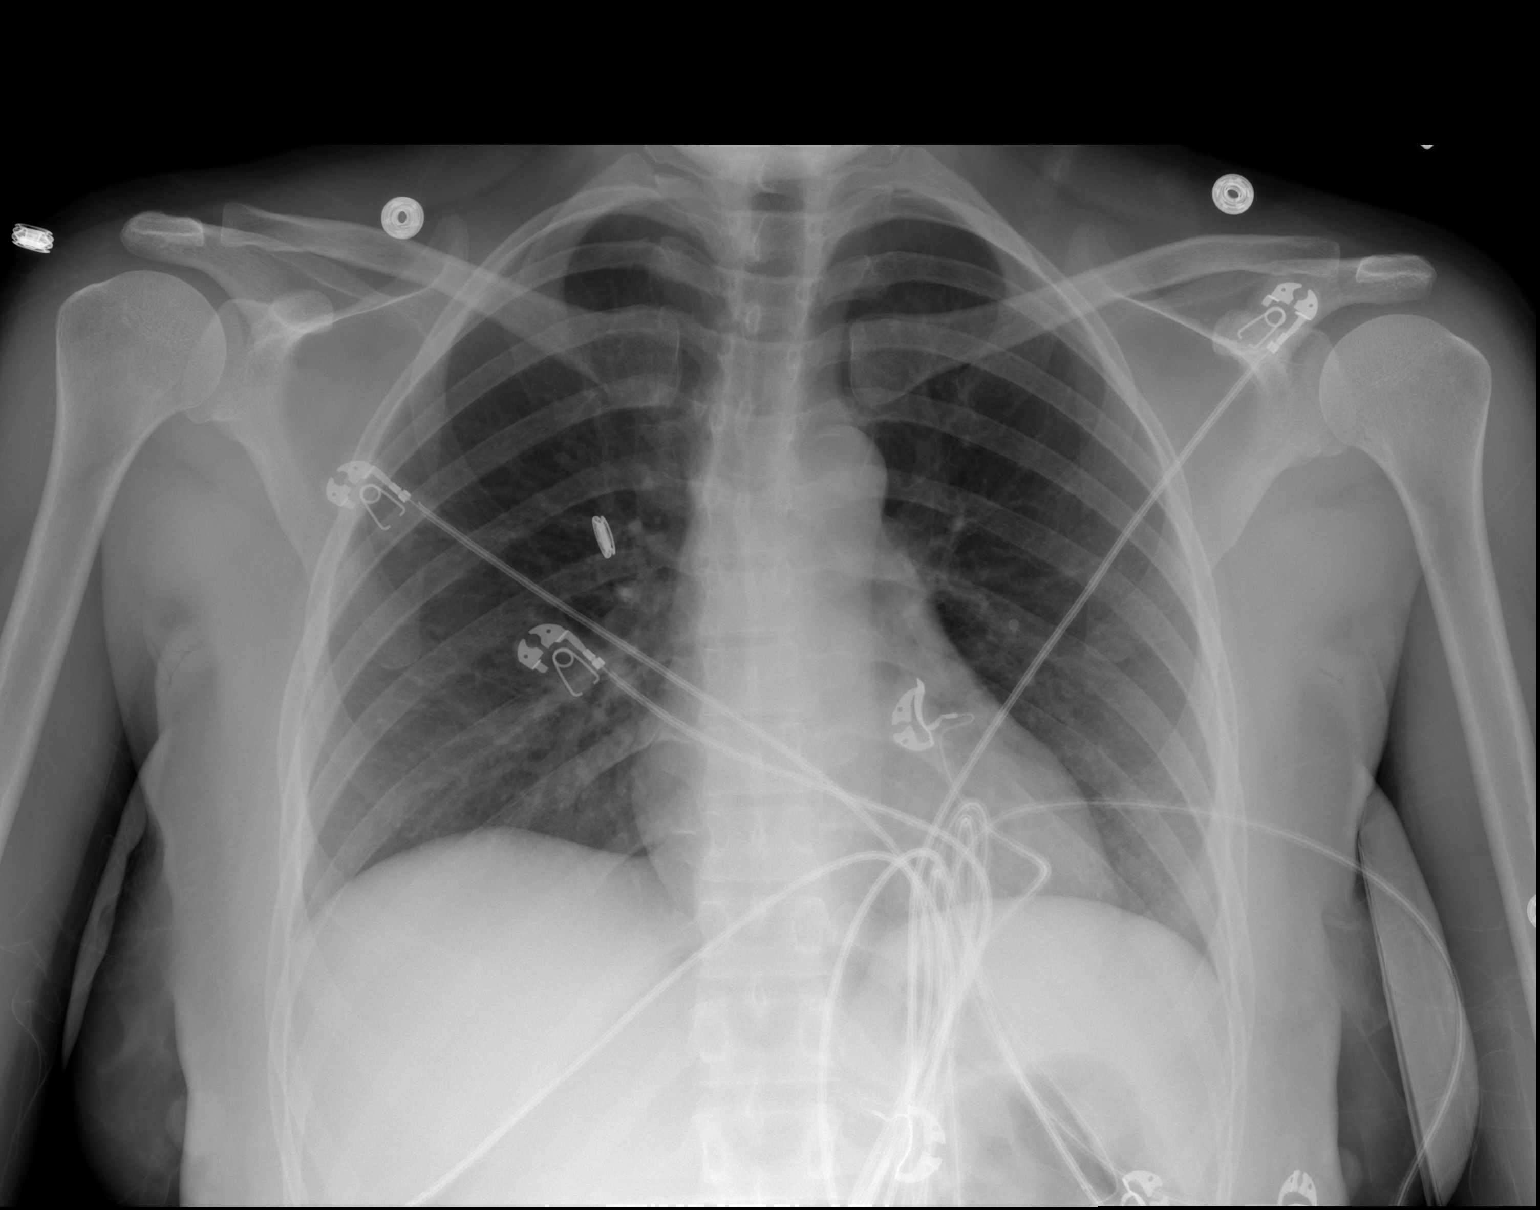

[w chest lat]
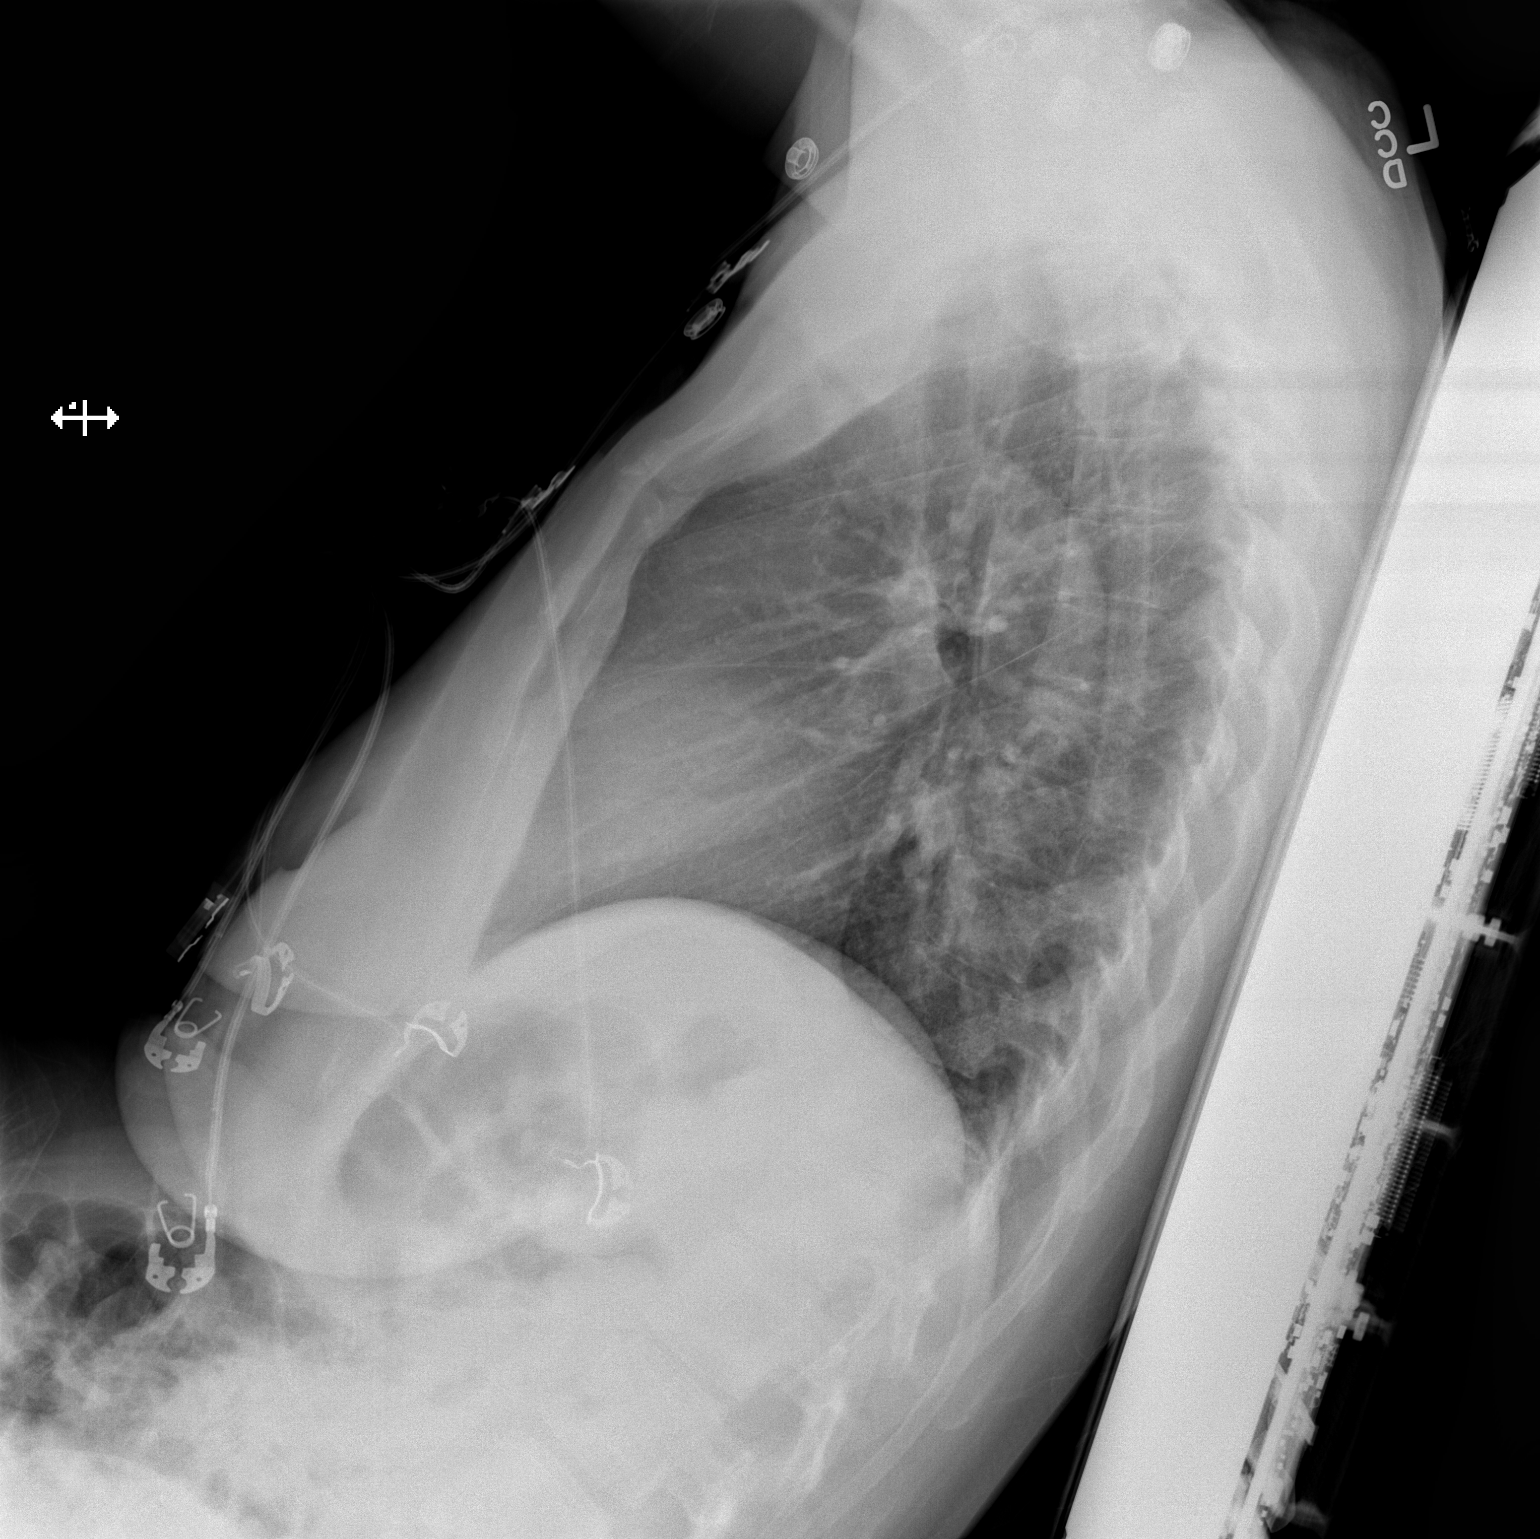

[2 of 2 positions shown; findings below may reference images not displayed]

FINDINGS: Normal heart size and mediastinal contours. No acute infiltrate or
edema. No effusion or pneumothorax. No acute osseous findings.
IMPRESSION: Negative chest.

## 2022-08-16 IMAGING — DX DG WRIST COMPLETE 3+V*R*
4 series · 4 of 4 positions shown · non-contrast
Comparison: 06/22/2004 right wrist radiographs

CLINICAL DATA: Right wrist pain after fall while skating 6 days
prior.

EXAM:
RIGHT WRIST - COMPLETE 3+ VIEW

[wrist pa]
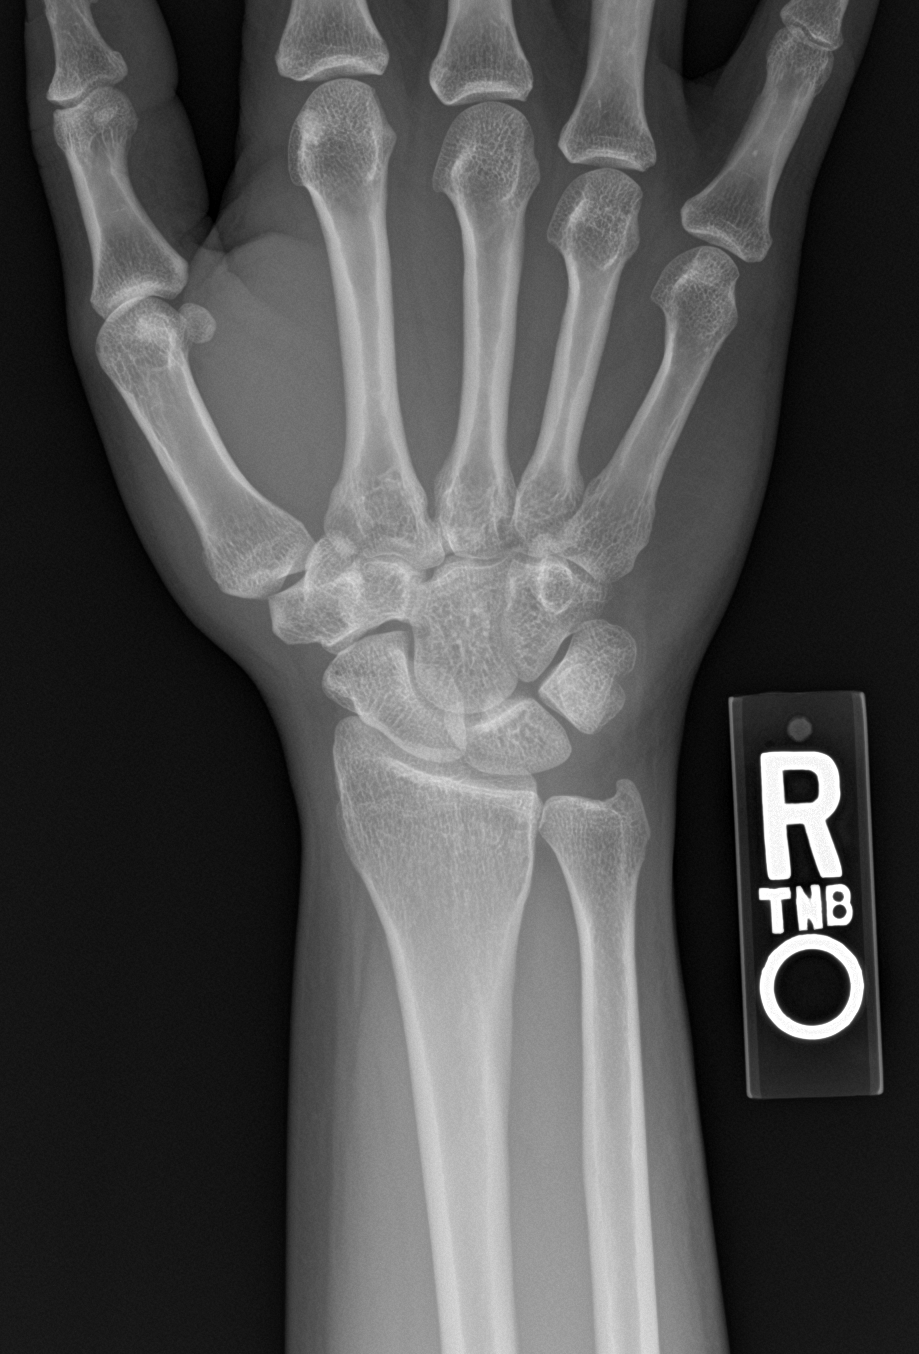

[wrist navicular]
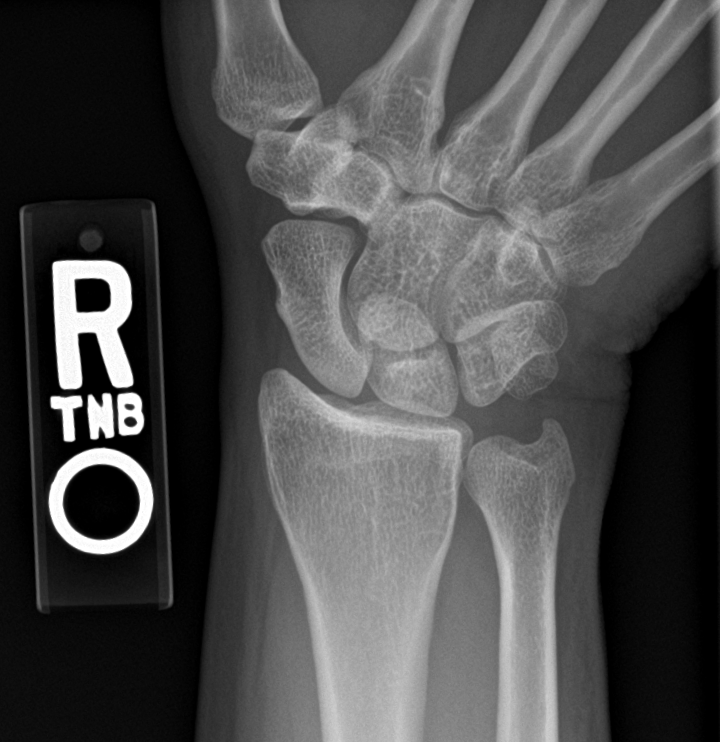

[wrist obl]
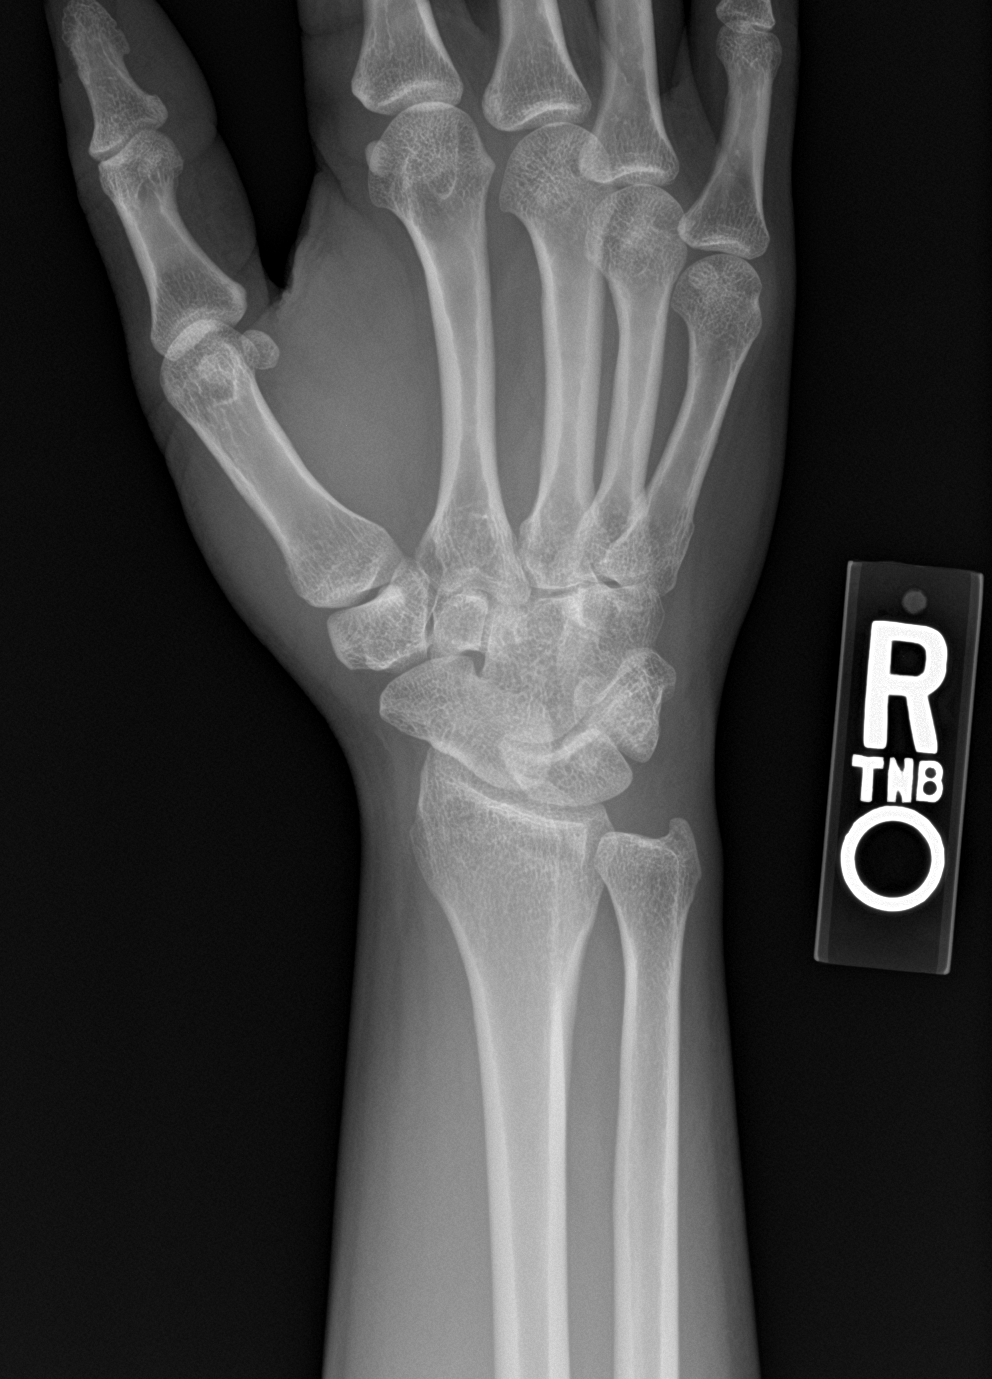

[wrist lat]
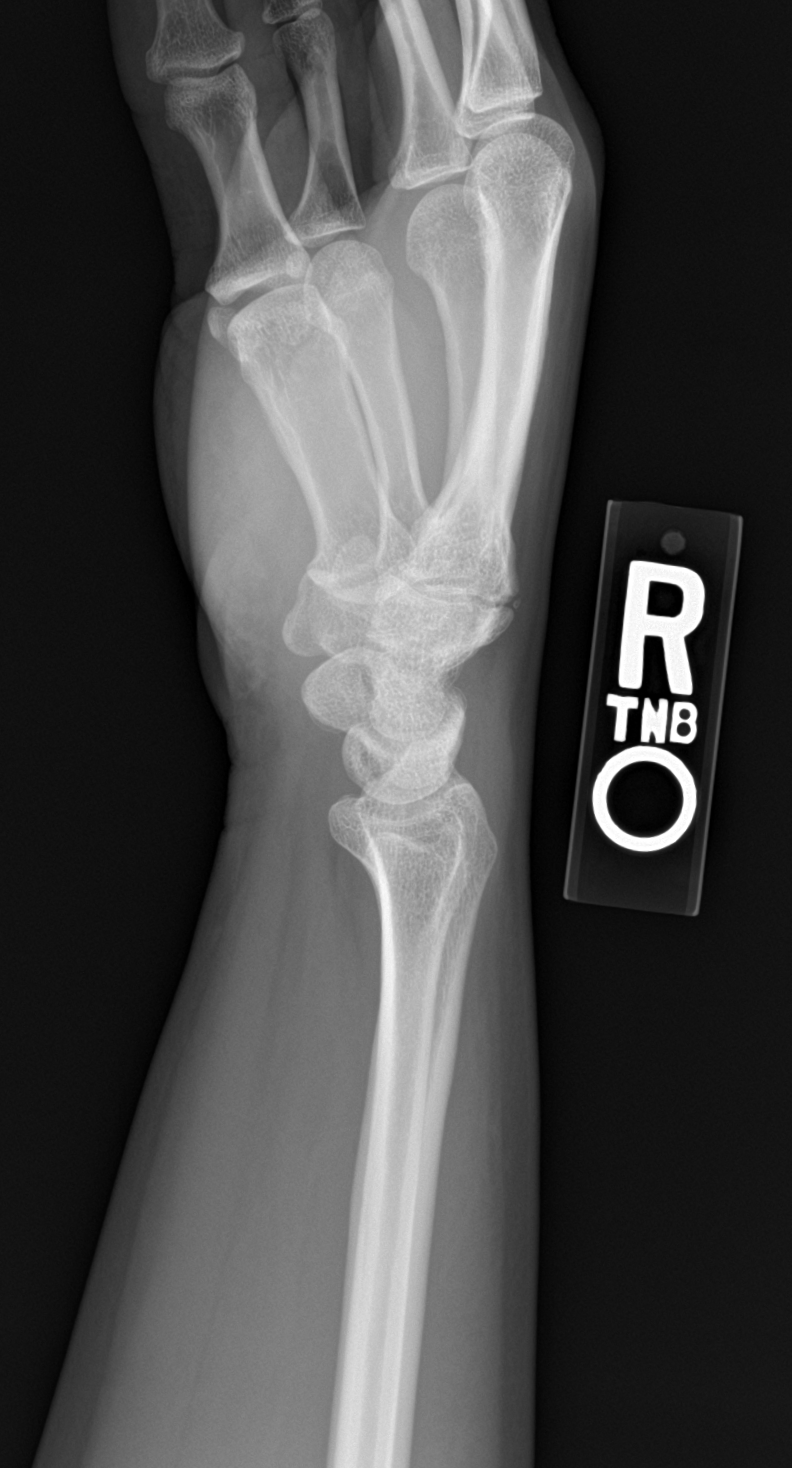

[4 of 4 positions shown; findings below may reference images not displayed]

FINDINGS: There is no evidence of fracture or dislocation. There is no
evidence of arthropathy or other focal bone abnormality. Soft
tissues are unremarkable.
IMPRESSION: No right wrist fracture or dislocation.

## 2024-01-28 ENCOUNTER — Emergency Department (HOSPITAL_COMMUNITY)
Admission: EM | Admit: 2024-01-28 | Discharge: 2024-01-28 | Disposition: A | Discharge status: Home / Self Care | Attending: Emergency Medicine | Admitting: Emergency Medicine

## 2024-01-28 DIAGNOSIS — B349 Viral infection, unspecified: Secondary | ICD-10-CM | POA: Insufficient documentation

## 2024-01-28 DIAGNOSIS — R059 Cough, unspecified: Secondary | ICD-10-CM | POA: Diagnosis present

## 2024-01-28 LAB — RESP PANEL BY RT-PCR (RSV, FLU A&B, COVID)  RVPGX2
Influenza A by PCR: NEGATIVE
Influenza B by PCR: NEGATIVE
Resp Syncytial Virus by PCR: NEGATIVE
SARS Coronavirus 2 by RT PCR: NEGATIVE

## 2024-01-28 LAB — GROUP A STREP BY PCR: Group A Strep by PCR: NOT DETECTED

## 2024-01-28 NOTE — ED Triage Notes (Signed)
 Pt c/o nausea, HA, nasal congestion, dry cough, sore throatx2wks

## 2024-01-28 NOTE — Discharge Instructions (Signed)
 As we discussed, your flu test and COVID test and RSV are negative.  Your strep test is also negative  You likely have a viral illness.  Take Tylenol  or Motrin  for fever  Please follow-up with your doctor  Return to ER if you have worse cough or trouble breathing or vomiting or persistent fever

## 2024-01-28 NOTE — ED Provider Notes (Signed)
 " [facility] Provider Note   CSN: [id number] Arrival date & time: 01/28/24  1614     Patient presents with: URI   TEYLA SKIDGEL is a 38 y.o. female here presenting with cough and congestion and sore throat for 2 weeks.  Patient has multiple sick family members sick with similar symptoms.  Patient is otherwise healthy.  In particular no history of asthma.   The history is provided by the patient.       Prior to Admission medications  Medication Sig Start Date End Date Taking? Authorizing Provider  cephALEXin  (KEFLEX ) 500 MG capsule Take 1 capsule (500 mg total) by mouth 2 (two) times daily. 12/29/19   Rolinda Rogue, MD  ibuprofen  (ADVIL ) 600 MG tablet Take 1 tablet (600 mg total) by mouth every 6 (six) hours as needed. 02/23/20   Corlis Burnard DEL, NP    Allergies: Patient has no known allergies.    Review of Systems  Respiratory:  Positive for cough.   All other systems reviewed and are negative.   Updated Vital Signs BP 121/89   Pulse 85   Temp 98.5 F (36.9 C) (Oral)   Resp 16   LMP 12/31/2023 (Approximate)   SpO2 99%   Physical Exam Vitals and nursing note reviewed.  Constitutional:      Appearance: Normal appearance.  HENT:     Head: Normocephalic.     Nose: Nose normal.     Mouth/Throat:     Mouth: Mucous membranes are moist.  Eyes:     Extraocular Movements: Extraocular movements intact.     Pupils: Pupils are equal, round, and reactive to light.  Cardiovascular:     Rate and Rhythm: Normal rate and regular rhythm.     Pulses: Normal pulses.     Heart sounds: Normal heart sounds.  Abdominal:     General: Abdomen is flat.     Palpations: Abdomen is soft.  Musculoskeletal:        General: Normal range of motion.     Cervical back: Normal range of motion and neck supple.  Skin:    General: Skin is warm.     Capillary Refill: Capillary refill takes less than 2 seconds.  Neurological:     General: No focal  deficit present.     Mental Status: She is alert and oriented to person, place, and time. Mental status is at baseline.  Psychiatric:        Mood and Affect: Mood normal.        Behavior: Behavior normal.     (all labs ordered are listed, but only abnormal results are displayed) Labs Reviewed  GROUP A STREP BY PCR  RESP PANEL BY RT-PCR (RSV, FLU A&B, COVID)  [id number]    EKG: None  Radiology: No results found.   Procedures   Medications Ordered in the ED - No data to display                                  Medical Decision Making JERUSHA REISING is a 38 y.o. female here presenting with cough and sore throat.  Likely viral syndrome.  COVID flu RSV negative and strep is negative.  Patient likely has respiratory virus.  Gave strict return precautions   Problems Addressed: Viral syndrome: acute illness or injury     Final diagnoses:  None    ED Discharge  Orders     None          Patt Alm Macho, MD 01/28/24 1958  "
